# Patient Record
Sex: Female | Born: 1985 | Race: White | Hispanic: No | Marital: Married | Smoking: Former smoker
Health system: Southern US, Community
[De-identification: ages and names within clinical notes are randomized; demographics above are authoritative.]

## PROBLEM LIST (undated history)

## (undated) ENCOUNTER — Inpatient Hospital Stay (HOSPITAL_COMMUNITY): Payer: Self-pay

## (undated) DIAGNOSIS — F419 Anxiety disorder, unspecified: Secondary | ICD-10-CM

## (undated) DIAGNOSIS — D649 Anemia, unspecified: Secondary | ICD-10-CM

## (undated) DIAGNOSIS — R519 Headache, unspecified: Secondary | ICD-10-CM

## (undated) DIAGNOSIS — O139 Gestational [pregnancy-induced] hypertension without significant proteinuria, unspecified trimester: Secondary | ICD-10-CM

## (undated) DIAGNOSIS — L309 Dermatitis, unspecified: Secondary | ICD-10-CM

## (undated) DIAGNOSIS — B359 Dermatophytosis, unspecified: Secondary | ICD-10-CM

## (undated) DIAGNOSIS — G43909 Migraine, unspecified, not intractable, without status migrainosus: Secondary | ICD-10-CM

## (undated) HISTORY — DX: Anemia, unspecified: D64.9

## (undated) HISTORY — DX: Gestational (pregnancy-induced) hypertension without significant proteinuria, unspecified trimester: O13.9

## (undated) HISTORY — PX: WISDOM TOOTH EXTRACTION: SHX21

## (undated) HISTORY — DX: Anxiety disorder, unspecified: F41.9

## (undated) HISTORY — DX: Migraine, unspecified, not intractable, without status migrainosus: G43.909

## (undated) HISTORY — DX: Headache, unspecified: R51.9

---

## 2008-06-01 ENCOUNTER — Ambulatory Visit: Payer: Self-pay | Admitting: Nurse Practitioner

## 2008-06-01 DIAGNOSIS — L259 Unspecified contact dermatitis, unspecified cause: Secondary | ICD-10-CM | POA: Insufficient documentation

## 2008-06-01 DIAGNOSIS — F411 Generalized anxiety disorder: Secondary | ICD-10-CM | POA: Insufficient documentation

## 2008-06-09 ENCOUNTER — Encounter (INDEPENDENT_AMBULATORY_CARE_PROVIDER_SITE_OTHER): Payer: Self-pay | Admitting: Nurse Practitioner

## 2008-09-01 ENCOUNTER — Telehealth (INDEPENDENT_AMBULATORY_CARE_PROVIDER_SITE_OTHER): Payer: Self-pay | Admitting: *Deleted

## 2009-01-28 ENCOUNTER — Encounter (INDEPENDENT_AMBULATORY_CARE_PROVIDER_SITE_OTHER): Payer: Self-pay | Admitting: Nurse Practitioner

## 2009-01-28 ENCOUNTER — Ambulatory Visit: Payer: Self-pay | Admitting: Nurse Practitioner

## 2009-01-28 DIAGNOSIS — F17200 Nicotine dependence, unspecified, uncomplicated: Secondary | ICD-10-CM

## 2009-01-28 LAB — CONVERTED CEMR LAB
Bilirubin Urine: NEGATIVE
Blood in Urine, dipstick: NEGATIVE
Glucose, Urine, Semiquant: NEGATIVE
KOH Prep: NEGATIVE
Ketones, urine, test strip: NEGATIVE
Nitrite: NEGATIVE
Specific Gravity, Urine: 1.015
Urobilinogen, UA: 0.2
WBC Urine, dipstick: NEGATIVE
pH: 7.5

## 2009-01-29 ENCOUNTER — Encounter (INDEPENDENT_AMBULATORY_CARE_PROVIDER_SITE_OTHER): Payer: Self-pay | Admitting: Nurse Practitioner

## 2009-02-04 ENCOUNTER — Encounter (INDEPENDENT_AMBULATORY_CARE_PROVIDER_SITE_OTHER): Payer: Self-pay | Admitting: Nurse Practitioner

## 2009-02-09 ENCOUNTER — Encounter (INDEPENDENT_AMBULATORY_CARE_PROVIDER_SITE_OTHER): Payer: Self-pay | Admitting: *Deleted

## 2009-02-11 LAB — CONVERTED CEMR LAB
ALT: 148 units/L — ABNORMAL HIGH (ref 0–35)
AST: 80 units/L — ABNORMAL HIGH (ref 0–37)
Albumin: 4.4 g/dL (ref 3.5–5.2)
Alkaline Phosphatase: 53 units/L (ref 39–117)
BUN: 7 mg/dL (ref 6–23)
Basophils Absolute: 0 10*3/uL (ref 0.0–0.1)
Basophils Relative: 0 % (ref 0–1)
CO2: 24 meq/L (ref 19–32)
Calcium: 9.4 mg/dL (ref 8.4–10.5)
Chlamydia, DNA Probe: NEGATIVE
Chloride: 106 meq/L (ref 96–112)
Cholesterol: 219 mg/dL — ABNORMAL HIGH (ref 0–200)
Creatinine, Ser: 0.75 mg/dL (ref 0.40–1.20)
Eosinophils Absolute: 0.1 10*3/uL (ref 0.0–0.7)
Eosinophils Relative: 1 % (ref 0–5)
GC Probe Amp, Genital: NEGATIVE
Glucose, Bld: 91 mg/dL (ref 70–99)
HCT: 37.6 % (ref 36.0–46.0)
HDL: 57 mg/dL (ref >39)
Hemoglobin: 12.5 g/dL (ref 12.0–15.0)
LDL Cholesterol: 144 mg/dL — ABNORMAL HIGH (ref 0–99)
Lymphocytes Relative: 32 % (ref 12–46)
Lymphs Abs: 1.8 10*3/uL (ref 0.7–4.0)
MCHC: 33.2 g/dL (ref 30.0–36.0)
MCV: 94.5 fL (ref 78.0–100.0)
Monocytes Absolute: 0.4 10*3/uL (ref 0.1–1.0)
Monocytes Relative: 8 % (ref 3–12)
Neutro Abs: 3.2 10*3/uL (ref 1.7–7.7)
Neutrophils Relative %: 59 % (ref 43–77)
Platelets: 215 10*3/uL (ref 150–400)
Potassium: 4 meq/L (ref 3.5–5.3)
RBC: 3.98 M/uL (ref 3.87–5.11)
RDW: 15.2 % (ref 11.5–15.5)
Sodium: 141 meq/L (ref 135–145)
TSH: 2.463 microintl units/mL (ref 0.350–4.500)
Total Bilirubin: 0.3 mg/dL (ref 0.3–1.2)
Total CHOL/HDL Ratio: 3.8
Total Protein: 7.3 g/dL (ref 6.0–8.3)
Triglycerides: 91 mg/dL (ref <150)
VLDL: 18 mg/dL (ref 0–40)
WBC: 5.5 10*3/uL (ref 4.0–10.5)

## 2009-02-12 ENCOUNTER — Telehealth (INDEPENDENT_AMBULATORY_CARE_PROVIDER_SITE_OTHER): Payer: Self-pay | Admitting: Nurse Practitioner

## 2009-02-17 ENCOUNTER — Telehealth (INDEPENDENT_AMBULATORY_CARE_PROVIDER_SITE_OTHER): Payer: Self-pay | Admitting: Nurse Practitioner

## 2010-02-10 ENCOUNTER — Emergency Department (HOSPITAL_COMMUNITY): Admission: EM | Admit: 2010-02-10 | Discharge: 2010-02-10 | Payer: Self-pay | Admitting: Emergency Medicine

## 2010-02-11 ENCOUNTER — Encounter: Payer: Self-pay | Admitting: Family Medicine

## 2010-02-11 ENCOUNTER — Ambulatory Visit (HOSPITAL_COMMUNITY): Admission: RE | Admit: 2010-02-11 | Discharge: 2010-02-11 | Payer: Self-pay | Admitting: Family Medicine

## 2010-03-03 ENCOUNTER — Ambulatory Visit (HOSPITAL_COMMUNITY): Admission: RE | Admit: 2010-03-03 | Discharge: 2010-03-03 | Payer: Self-pay | Admitting: Family Medicine

## 2010-03-10 ENCOUNTER — Ambulatory Visit: Payer: Self-pay | Admitting: Family Medicine

## 2010-03-10 ENCOUNTER — Encounter (INDEPENDENT_AMBULATORY_CARE_PROVIDER_SITE_OTHER): Payer: Self-pay | Admitting: *Deleted

## 2010-03-10 LAB — CONVERTED CEMR LAB
ALT: 36 units/L — ABNORMAL HIGH (ref 0–35)
AST: 26 units/L (ref 0–37)
Albumin: 3.8 g/dL (ref 3.5–5.2)
Alkaline Phosphatase: 47 units/L (ref 39–117)
BUN: 5 mg/dL — ABNORMAL LOW (ref 6–23)
CO2: 18 meq/L — ABNORMAL LOW (ref 19–32)
Calcium: 9.2 mg/dL (ref 8.4–10.5)
Chloride: 106 meq/L (ref 96–112)
Creatinine, Ser: 0.48 mg/dL (ref 0.40–1.20)
Glucose, Bld: 88 mg/dL (ref 70–99)
HCT: 33.1 % — ABNORMAL LOW (ref 36.0–46.0)
Hemoglobin: 11.2 g/dL — ABNORMAL LOW (ref 12.0–15.0)
MCHC: 33.8 g/dL (ref 30.0–36.0)
MCV: 93.5 fL (ref 78.0–100.0)
Platelets: 273 10*3/uL (ref 150–400)
Potassium: 3.9 meq/L (ref 3.5–5.3)
RBC: 3.54 M/uL — ABNORMAL LOW (ref 3.87–5.11)
RDW: 14.1 % (ref 11.5–15.5)
Sodium: 135 meq/L (ref 135–145)
TSH: 1.395 microintl units/mL (ref 0.350–4.500)
Total Bilirubin: 0.2 mg/dL — ABNORMAL LOW (ref 0.3–1.2)
Total Protein: 6.7 g/dL (ref 6.0–8.3)
Uric Acid, Serum: 4 mg/dL (ref 2.4–7.0)
WBC: 8.6 10*3/uL (ref 4.0–10.5)

## 2010-03-12 ENCOUNTER — Encounter (INDEPENDENT_AMBULATORY_CARE_PROVIDER_SITE_OTHER): Payer: Self-pay | Admitting: *Deleted

## 2010-03-12 LAB — CONVERTED CEMR LAB
Collection Interval-CRCL: 24 hr
Creatinine 24 HR UR: 1311 mg/24hr (ref 700–1800)
Creatinine Clearance: 190 mL/min — ABNORMAL HIGH (ref 75–115)
Creatinine, Urine: 49.9 mg/dL

## 2010-03-24 ENCOUNTER — Ambulatory Visit: Payer: Self-pay | Admitting: Family Medicine

## 2010-03-24 ENCOUNTER — Ambulatory Visit (HOSPITAL_COMMUNITY): Admission: RE | Admit: 2010-03-24 | Discharge: 2010-03-24 | Payer: Self-pay | Admitting: Family Medicine

## 2010-04-14 ENCOUNTER — Ambulatory Visit: Payer: Self-pay | Admitting: Obstetrics and Gynecology

## 2010-04-14 ENCOUNTER — Encounter (INDEPENDENT_AMBULATORY_CARE_PROVIDER_SITE_OTHER): Payer: Self-pay | Admitting: *Deleted

## 2010-04-14 LAB — CONVERTED CEMR LAB
Clue Cells Wet Prep HPF POC: NONE SEEN
Trich, Wet Prep: NONE SEEN

## 2010-05-05 ENCOUNTER — Ambulatory Visit: Payer: Self-pay | Admitting: Family Medicine

## 2010-05-05 ENCOUNTER — Encounter: Payer: Self-pay | Admitting: Obstetrics and Gynecology

## 2010-05-26 ENCOUNTER — Encounter: Payer: Self-pay | Admitting: Physician Assistant

## 2010-05-26 ENCOUNTER — Ambulatory Visit: Payer: Self-pay | Admitting: Obstetrics & Gynecology

## 2010-05-26 LAB — CONVERTED CEMR LAB
ALT: 11 units/L (ref 0–35)
AST: 15 units/L (ref 0–37)
Albumin: 3.6 g/dL (ref 3.5–5.2)
Alkaline Phosphatase: 85 units/L (ref 39–117)
BUN: 6 mg/dL (ref 6–23)
CO2: 16 meq/L — ABNORMAL LOW (ref 19–32)
Calcium: 8.8 mg/dL (ref 8.4–10.5)
Chloride: 104 meq/L (ref 96–112)
Creatinine, Ser: 0.43 mg/dL (ref 0.40–1.20)
Creatinine, Urine: 105.3 mg/dL
Glucose, Bld: 150 mg/dL — ABNORMAL HIGH (ref 70–99)
HCT: 30.2 % — ABNORMAL LOW (ref 36.0–46.0)
Hemoglobin: 9.7 g/dL — ABNORMAL LOW (ref 12.0–15.0)
MCHC: 32.1 g/dL (ref 30.0–36.0)
MCV: 93.8 fL (ref 78.0–100.0)
Platelets: 291 10*3/uL (ref 150–400)
Potassium: 3.9 meq/L (ref 3.5–5.3)
RBC: 3.22 M/uL — ABNORMAL LOW (ref 3.87–5.11)
RDW: 13.9 % (ref 11.5–15.5)
Sodium: 137 meq/L (ref 135–145)
Total Bilirubin: 0.2 mg/dL — ABNORMAL LOW (ref 0.3–1.2)
Total Protein, Urine: 4
Total Protein: 5.9 g/dL — ABNORMAL LOW (ref 6.0–8.3)
WBC: 12 10*3/uL — ABNORMAL HIGH (ref 4.0–10.5)

## 2010-06-02 ENCOUNTER — Ambulatory Visit (HOSPITAL_COMMUNITY): Admission: RE | Admit: 2010-06-02 | Discharge: 2010-06-02 | Payer: Self-pay | Admitting: Obstetrics and Gynecology

## 2010-06-05 ENCOUNTER — Ambulatory Visit: Payer: Self-pay | Admitting: Obstetrics and Gynecology

## 2010-06-05 ENCOUNTER — Inpatient Hospital Stay (HOSPITAL_COMMUNITY): Admission: AD | Admit: 2010-06-05 | Discharge: 2010-06-05 | Payer: Self-pay | Admitting: Obstetrics & Gynecology

## 2010-06-09 ENCOUNTER — Ambulatory Visit: Payer: Self-pay | Admitting: Obstetrics & Gynecology

## 2010-06-14 ENCOUNTER — Inpatient Hospital Stay (HOSPITAL_COMMUNITY): Admission: AD | Admit: 2010-06-14 | Discharge: 2010-06-14 | Payer: Self-pay | Admitting: Family Medicine

## 2010-06-23 ENCOUNTER — Ambulatory Visit: Payer: Self-pay | Admitting: Family Medicine

## 2010-06-30 ENCOUNTER — Ambulatory Visit: Payer: Self-pay | Admitting: Obstetrics & Gynecology

## 2010-06-30 ENCOUNTER — Encounter: Payer: Self-pay | Admitting: Physician Assistant

## 2010-06-30 LAB — CONVERTED CEMR LAB
Clue Cells Wet Prep HPF POC: NONE SEEN
Trich, Wet Prep: NONE SEEN

## 2010-07-04 ENCOUNTER — Ambulatory Visit: Payer: Self-pay | Admitting: Obstetrics and Gynecology

## 2010-07-07 ENCOUNTER — Ambulatory Visit: Payer: Self-pay | Admitting: Obstetrics and Gynecology

## 2010-07-11 ENCOUNTER — Ambulatory Visit: Payer: Self-pay | Admitting: Obstetrics & Gynecology

## 2010-07-11 ENCOUNTER — Ambulatory Visit (HOSPITAL_COMMUNITY)
Admission: RE | Admit: 2010-07-11 | Discharge: 2010-07-11 | Payer: Self-pay | Source: Home / Self Care | Admitting: Obstetrics & Gynecology

## 2010-07-13 ENCOUNTER — Ambulatory Visit: Payer: Self-pay | Admitting: Obstetrics and Gynecology

## 2010-07-18 ENCOUNTER — Ambulatory Visit: Payer: Self-pay | Admitting: Obstetrics & Gynecology

## 2010-07-21 ENCOUNTER — Ambulatory Visit: Payer: Self-pay | Admitting: Family Medicine

## 2010-07-25 ENCOUNTER — Ambulatory Visit: Payer: Self-pay | Admitting: Physician Assistant

## 2010-07-28 ENCOUNTER — Inpatient Hospital Stay (HOSPITAL_COMMUNITY): Admission: AD | Admit: 2010-07-28 | Discharge: 2010-07-08 | Payer: Self-pay | Admitting: Obstetrics & Gynecology

## 2010-07-28 ENCOUNTER — Encounter: Payer: Self-pay | Admitting: Physician Assistant

## 2010-07-28 ENCOUNTER — Ambulatory Visit: Payer: Self-pay | Admitting: Obstetrics & Gynecology

## 2010-07-28 LAB — CONVERTED CEMR LAB
Chlamydia, DNA Probe: NEGATIVE
GC Probe Amp, Genital: NEGATIVE

## 2010-07-29 ENCOUNTER — Encounter: Payer: Self-pay | Admitting: Obstetrics & Gynecology

## 2010-08-01 ENCOUNTER — Ambulatory Visit: Payer: Self-pay | Admitting: Obstetrics & Gynecology

## 2010-08-04 ENCOUNTER — Ambulatory Visit: Payer: Self-pay | Admitting: Obstetrics & Gynecology

## 2010-08-08 ENCOUNTER — Ambulatory Visit: Payer: Self-pay | Admitting: Obstetrics & Gynecology

## 2010-08-08 ENCOUNTER — Ambulatory Visit (HOSPITAL_COMMUNITY)
Admission: RE | Admit: 2010-08-08 | Discharge: 2010-08-08 | Payer: Self-pay | Source: Home / Self Care | Attending: Family Medicine | Admitting: Family Medicine

## 2010-08-11 ENCOUNTER — Ambulatory Visit: Payer: Self-pay | Admitting: Family Medicine

## 2010-08-11 ENCOUNTER — Encounter: Payer: Self-pay | Admitting: Obstetrics and Gynecology

## 2010-08-11 ENCOUNTER — Inpatient Hospital Stay (HOSPITAL_COMMUNITY)
Admission: AD | Admit: 2010-08-11 | Discharge: 2010-08-12 | Payer: Self-pay | Source: Home / Self Care | Attending: Obstetrics & Gynecology | Admitting: Obstetrics & Gynecology

## 2010-08-16 ENCOUNTER — Ambulatory Visit: Payer: Self-pay | Admitting: Obstetrics and Gynecology

## 2010-08-18 ENCOUNTER — Ambulatory Visit: Payer: Self-pay | Admitting: Obstetrics & Gynecology

## 2010-08-20 ENCOUNTER — Inpatient Hospital Stay (HOSPITAL_COMMUNITY)
Admission: AD | Admit: 2010-08-20 | Discharge: 2010-08-20 | Payer: Self-pay | Source: Home / Self Care | Attending: Obstetrics & Gynecology | Admitting: Obstetrics & Gynecology

## 2010-08-21 DIAGNOSIS — O139 Gestational [pregnancy-induced] hypertension without significant proteinuria, unspecified trimester: Secondary | ICD-10-CM

## 2010-08-21 HISTORY — DX: Gestational (pregnancy-induced) hypertension without significant proteinuria, unspecified trimester: O13.9

## 2010-08-22 ENCOUNTER — Ambulatory Visit
Admission: RE | Admit: 2010-08-22 | Discharge: 2010-08-22 | Payer: Self-pay | Source: Home / Self Care | Attending: Obstetrics and Gynecology | Admitting: Obstetrics and Gynecology

## 2010-08-23 ENCOUNTER — Inpatient Hospital Stay (HOSPITAL_COMMUNITY)
Admission: RE | Admit: 2010-08-23 | Discharge: 2010-08-28 | Payer: Self-pay | Source: Home / Self Care | Attending: Family Medicine | Admitting: Family Medicine

## 2010-08-26 LAB — CBC
HCT: 19.1 % — ABNORMAL LOW (ref 36.0–46.0)
Hemoglobin: 6.3 g/dL — CL (ref 12.0–15.0)
MCH: 27.9 pg (ref 26.0–34.0)
MCHC: 33 g/dL (ref 30.0–36.0)
MCV: 84.5 fL (ref 78.0–100.0)
Platelets: 183 10*3/uL (ref 150–400)
RBC: 2.26 MIL/uL — ABNORMAL LOW (ref 3.87–5.11)
RDW: 16.6 % — ABNORMAL HIGH (ref 11.5–15.5)
WBC: 16.5 10*3/uL — ABNORMAL HIGH (ref 4.0–10.5)

## 2010-09-05 LAB — CBC
HCT: 19.5 % — ABNORMAL LOW (ref 36.0–46.0)
Hemoglobin: 6.3 g/dL — CL (ref 12.0–15.0)
MCH: 28 pg (ref 26.0–34.0)
MCHC: 32.3 g/dL (ref 30.0–36.0)
MCV: 86.7 fL (ref 78.0–100.0)
Platelets: 260 10*3/uL (ref 150–400)
RBC: 2.25 MIL/uL — ABNORMAL LOW (ref 3.87–5.11)
RDW: 17.2 % — ABNORMAL HIGH (ref 11.5–15.5)
WBC: 8.3 10*3/uL (ref 4.0–10.5)

## 2010-09-10 NOTE — Discharge Summary (Signed)
  NAMESOLASH, TULLO               ACCOUNT NO.:  [id number]  MEDICAL RECORD NO.:  [id number]          PATIENT TYPE:  INP  LOCATION:  9133                          [facility]ILITY:  [facility]  PHYSICIAN:  Tilda Burrow, M.D. DATE OF BIRTH:  26-Nov-1985  DATE OF ADMISSION:  08/23/2010 DATE OF DISCHARGE:  08/28/2010                              DISCHARGE SUMMARY   ADMISSION DIAGNOSES: 1. Chronic hypertension. 2. Intrauterine pregnancy at 40 and 1 week.  LABORATORY DATA:  Hemoglobin 6.3 on August 27, 2010.  HISTORY AND PHYSICAL:  This is a 25 year old G1 female at 26 weeks' gestation who was admitted for induction of labor due to chronic hypertension.  Her induction was pursued initially, however, was deemed a failure secondary to arrest of descent and cephalopelvic disproportion.  Additionally, the patient developed a low-grade temperature and was treated with ampicillin and gentamicin for chorioamnionitis.  Due to these complications, the patient was scheduled for a primary low transverse cervical C-section which was performed on August 25, 2010.  This procedure was uncomplicated with an EBL of 700 mL.  She delivered a viable female infant, was found to be in right occiput posterior position, weighing 9 pounds and 9 ounces.  Apgars were 9 and 9 at 1 and 5 minutes respectively.  The patient's postoperative course was uncomplicated with exception for development of blood loss anemia with hemoglobin of 6.3 postoperatively.  Initially, the patient experienced some mild dizziness, however, orthostatics were negative and repeat CBC found her hemoglobin to be stable at postop day #2.  The patient was asymptomatic at the time of discharge.  She will receive a Depo-Provera injection prior to discharge and plans to bottle feed her infant.  He also received a circumcision on the day of discharge.  DISPOSITION:  The patient will be discharged to home with her in stable medical condition.  DISCHARGE  MEDICATIONS: 1. Percocet 5/325 mg p.o. q.4 hours p.r.n. 2. Motrin 600 mg p.o. q.6 hours p.r.n. 3. Iron sulfate 325 mg p.o. b.i.d. 4. Colace 100 mg p.o. p.r.n.  DISCHARGE FOLLOWUP:  The patient will follow up with [facility] within 6 weeks after discharge.  DISCHARGE ATTENDING:  Tilda Burrow, MD    ______________________________ Lloyd Huger, MD   ______________________________ Tilda Burrow, M.D.    JK/MEDQ  D:  08/28/2010  T:  08/29/2010  Job:  [id number]  Electronically Signed by Lloyd Huger MD on 08/31/2010 02:19:08 PM Electronically Signed by Christin Bach M.D. on 09/10/2010 06:56:56 AM

## 2010-09-22 ENCOUNTER — Ambulatory Visit: Admit: 2010-09-22 | Payer: Self-pay | Admitting: Obstetrics and Gynecology

## 2010-09-22 ENCOUNTER — Ambulatory Visit: Payer: Self-pay | Admitting: Physician Assistant

## 2010-09-22 ENCOUNTER — Encounter: Payer: Self-pay | Admitting: Physician Assistant

## 2010-09-22 LAB — CONVERTED CEMR LAB
HCT: 38.4 % (ref 36.0–46.0)
Hemoglobin: 12.5 g/dL (ref 12.0–15.0)
MCHC: 32.6 g/dL (ref 30.0–36.0)
MCV: 84.4 fL (ref 78.0–100.0)
Platelets: 283 10*3/uL (ref 150–400)
RBC: 4.55 M/uL (ref 3.87–5.11)
RDW: 15.9 % — ABNORMAL HIGH (ref 11.5–15.5)
WBC: 7.1 10*3/uL (ref 4.0–10.5)

## 2010-10-31 LAB — POCT URINALYSIS DIPSTICK
Bilirubin Urine: NEGATIVE
Bilirubin Urine: NEGATIVE
Bilirubin Urine: NEGATIVE
Glucose, UA: NEGATIVE mg/dL
Glucose, UA: NEGATIVE mg/dL
Glucose, UA: NEGATIVE mg/dL
Hgb urine dipstick: NEGATIVE
Hgb urine dipstick: NEGATIVE
Hgb urine dipstick: NEGATIVE
Ketones, ur: NEGATIVE mg/dL
Nitrite: NEGATIVE
Nitrite: NEGATIVE
Nitrite: NEGATIVE
Protein, ur: NEGATIVE mg/dL
Protein, ur: NEGATIVE mg/dL
Protein, ur: NEGATIVE mg/dL
Specific Gravity, Urine: 1.025 (ref 1.005–1.030)
Specific Gravity, Urine: 1.025 (ref 1.005–1.030)
Specific Gravity, Urine: >=1.03 (ref 1.005–1.030)
Urobilinogen, UA: 0.2 mg/dL (ref 0.0–1.0)
Urobilinogen, UA: 0.2 mg/dL (ref 0.0–1.0)
Urobilinogen, UA: 0.2 mg/dL (ref 0.0–1.0)
pH: 6.5 (ref 5.0–8.0)
pH: 6.5 (ref 5.0–8.0)
pH: 7 (ref 5.0–8.0)

## 2010-10-31 LAB — WET PREP, GENITAL
Clue Cells Wet Prep HPF POC: NONE SEEN
Trich, Wet Prep: NONE SEEN
Yeast Wet Prep HPF POC: NONE SEEN

## 2010-10-31 LAB — CBC
Hemoglobin: 10.2 g/dL — ABNORMAL LOW (ref 12.0–15.0)
MCH: 28.3 pg (ref 26.0–34.0)
MCHC: 32.9 g/dL (ref 30.0–36.0)
MCV: 85.9 fL (ref 78.0–100.0)

## 2010-10-31 LAB — GLUCOSE, CAPILLARY
Glucose-Capillary: 107 mg/dL — ABNORMAL HIGH (ref 70–99)
Glucose-Capillary: 126 mg/dL — ABNORMAL HIGH (ref 70–99)
Glucose-Capillary: 81 mg/dL (ref 70–99)

## 2010-10-31 LAB — RPR: RPR Ser Ql: NONREACTIVE

## 2010-11-01 LAB — POCT URINALYSIS DIPSTICK
Glucose, UA: NEGATIVE mg/dL
Glucose, UA: NEGATIVE mg/dL
Hgb urine dipstick: NEGATIVE
Hgb urine dipstick: NEGATIVE
Hgb urine dipstick: NEGATIVE
Ketones, ur: NEGATIVE mg/dL
Ketones, ur: NEGATIVE mg/dL
Nitrite: NEGATIVE
Nitrite: NEGATIVE
Nitrite: NEGATIVE
Protein, ur: NEGATIVE mg/dL
Protein, ur: NEGATIVE mg/dL
Protein, ur: NEGATIVE mg/dL
Protein, ur: NEGATIVE mg/dL
Protein, ur: NEGATIVE mg/dL
Specific Gravity, Urine: 1.02 (ref 1.005–1.030)
Specific Gravity, Urine: 1.02 (ref 1.005–1.030)
Specific Gravity, Urine: 1.025 (ref 1.005–1.030)
Urobilinogen, UA: 0.2 mg/dL (ref 0.0–1.0)
Urobilinogen, UA: 0.2 mg/dL (ref 0.0–1.0)
Urobilinogen, UA: 0.2 mg/dL (ref 0.0–1.0)
Urobilinogen, UA: 0.2 mg/dL (ref 0.0–1.0)
Urobilinogen, UA: 0.2 mg/dL (ref 0.0–1.0)
pH: 6.5 (ref 5.0–8.0)
pH: 7 (ref 5.0–8.0)
pH: 6 (ref 5.0–8.0)
pH: 6.5 (ref 5.0–8.0)

## 2010-11-01 LAB — URINALYSIS, ROUTINE W REFLEX MICROSCOPIC
Glucose, UA: NEGATIVE mg/dL
Ketones, ur: NEGATIVE mg/dL
Leukocytes, UA: NEGATIVE
Protein, ur: NEGATIVE mg/dL
Urobilinogen, UA: 0.2 mg/dL (ref 0.0–1.0)

## 2010-11-01 LAB — URINE MICROSCOPIC-ADD ON

## 2010-11-02 LAB — POCT URINALYSIS DIPSTICK
Glucose, UA: NEGATIVE mg/dL
Hgb urine dipstick: NEGATIVE
Ketones, ur: NEGATIVE mg/dL
Specific Gravity, Urine: 1.02 (ref 1.005–1.030)

## 2010-11-02 LAB — URINALYSIS, ROUTINE W REFLEX MICROSCOPIC
Bilirubin Urine: NEGATIVE
Ketones, ur: NEGATIVE mg/dL
Nitrite: NEGATIVE
Protein, ur: NEGATIVE mg/dL
Urobilinogen, UA: 0.2 mg/dL (ref 0.0–1.0)

## 2010-11-03 LAB — POCT URINALYSIS DIPSTICK
Bilirubin Urine: NEGATIVE
Ketones, ur: NEGATIVE mg/dL
Ketones, ur: NEGATIVE mg/dL
Protein, ur: NEGATIVE mg/dL
Protein, ur: NEGATIVE mg/dL
Specific Gravity, Urine: 1.025 (ref 1.005–1.030)
Specific Gravity, Urine: 1.02 (ref 1.005–1.030)
Urobilinogen, UA: 0.2 mg/dL (ref 0.0–1.0)
pH: 6 (ref 5.0–8.0)

## 2010-11-04 LAB — POCT URINALYSIS DIPSTICK
Bilirubin Urine: NEGATIVE
Bilirubin Urine: NEGATIVE
Hgb urine dipstick: NEGATIVE
Hgb urine dipstick: NEGATIVE
Ketones, ur: NEGATIVE mg/dL
Nitrite: NEGATIVE
Protein, ur: NEGATIVE mg/dL
Specific Gravity, Urine: 1.025 (ref 1.005–1.030)
Urobilinogen, UA: 0.2 mg/dL (ref 0.0–1.0)
pH: 6 (ref 5.0–8.0)
pH: 5.5 (ref 5.0–8.0)

## 2010-11-05 LAB — POCT URINALYSIS DIP (DEVICE)
Ketones, ur: NEGATIVE mg/dL
Protein, ur: NEGATIVE mg/dL
Specific Gravity, Urine: 1.02 (ref 1.005–1.030)
Urobilinogen, UA: 0.2 mg/dL (ref 0.0–1.0)
pH: 7 (ref 5.0–8.0)

## 2010-11-06 LAB — URINALYSIS, ROUTINE W REFLEX MICROSCOPIC
Bilirubin Urine: NEGATIVE
Glucose, UA: NEGATIVE mg/dL
Ketones, ur: NEGATIVE mg/dL
Protein, ur: NEGATIVE mg/dL
pH: 6.5 (ref 5.0–8.0)

## 2010-11-06 LAB — URINE MICROSCOPIC-ADD ON

## 2010-11-06 LAB — WET PREP, GENITAL: Yeast Wet Prep HPF POC: NONE SEEN

## 2010-11-06 LAB — HCG, QUANTITATIVE, PREGNANCY: hCG, Beta Chain, Quant, S: 77130 m[IU]/mL — ABNORMAL HIGH (ref <5)

## 2010-11-06 LAB — ABO/RH: ABO/RH(D): A POS

## 2010-11-09 ENCOUNTER — Encounter: Payer: Self-pay | Admitting: Family Medicine

## 2010-11-09 ENCOUNTER — Ambulatory Visit (INDEPENDENT_AMBULATORY_CARE_PROVIDER_SITE_OTHER): Payer: Medicaid Other | Admitting: Family Medicine

## 2010-11-09 VITALS — BP 129/78 | HR 85 | Temp 98.5°F | Ht 65.5 in | Wt 173.8 lb

## 2010-11-09 DIAGNOSIS — D649 Anemia, unspecified: Secondary | ICD-10-CM

## 2010-11-09 DIAGNOSIS — F411 Generalized anxiety disorder: Secondary | ICD-10-CM

## 2010-11-09 DIAGNOSIS — IMO0002 Reserved for concepts with insufficient information to code with codable children: Secondary | ICD-10-CM

## 2010-11-09 DIAGNOSIS — O139 Gestational [pregnancy-induced] hypertension without significant proteinuria, unspecified trimester: Secondary | ICD-10-CM

## 2010-11-09 MED ORDER — ESCITALOPRAM OXALATE 20 MG PO TABS: ORAL_TABLET | ORAL | Status: DC

## 2010-11-09 NOTE — Patient Instructions (Addendum)
Start lexapro as discussed Consider adding daily exercise to help with mood and energy Make follow-up in 3-4 weeks Consider therapy- See info for Dr. Pascal Lux Anxiety and Panic Attacks Your caregiver has informed you that you are having an anxiety or panic attack. There may be many forms of this. Most of the time these attacks come suddenly and without warning. They come at any time of day, including periods of sleep, and at any time of life. They may be strong and unexplained. Although panic attacks are very scary, they are physically harmless. Sometimes the cause of your anxiety is not known. Anxiety is a protective mechanism of the body in its fight or flight mechanism. Most of these perceived danger situations are actually nonphysical situations (such as anxiety over losing a job). CAUSES The causes of an anxiety or panic attack are many. Panic attacks may occur in otherwise healthy people given a certain set of circumstances. There may be a genetic cause for panic attacks. Some medications may also have anxiety as a side effect. SYMPTOMS Some of the most common feelings are:  Intense terror.   Dizziness, feeling faint.     Hot and cold flashes.     Fear of going crazy.     Feelings that nothing is real.     Sweating.    Shaking.    Chest pain or a fast heartbeat (palpitations).   Smothering, choking sensations.     Feelings of impending doom and that death is near.     Tingling of extremities, this may be from over breathing.     Altered reality (derealization).     Being detached from yourself (depersonalization).     Several symptoms can be present to make up anxiety or panic attacks. DIAGNOSIS The evaluation by your caregiver will depend on the type of symptoms you are experiencing. The diagnosis of anxiety or pain attack is made when no physical illness can be determined to be a cause of the symptoms. TREATMENT Treatment to prevent anxiety and panic attacks may  include:  Avoidance of circumstances that cause anxiety.   Reassurance and relaxation.   Regular exercise.   Relaxation therapies, such as yoga.   Psychotherapy with a psychiatrist or therapist.   Avoidance of caffeine, alcohol and illegal drugs.   Prescribed medication.  SEEK IMMEDIATE MEDICAL CARE IF:  You experience panic attack symptoms that are different than your usual symptoms.   You have any worsening or concerning symptoms.  Document Released: 08/07/2005 Document Re-Released: 01/25/2010 [facility] Patient Information 2011 [place], [place].

## 2010-11-09 NOTE — Assessment & Plan Note (Signed)
Significant anemia 6.3 in January, patient does no think has been check since.  Will recheck today.

## 2010-11-09 NOTE — Assessment & Plan Note (Addendum)
BP Readings from Last 3 Encounters:  11/09/10 129/78  01/28/09 120/74  06/01/08 120/74   Seems to be resolved now.  History of elevated liver enzymes.  Will recheck today as starting new med.

## 2010-11-09 NOTE — Progress Notes (Signed)
  Subjective:    Patient ID: Tracey Chen, female    DOB: 1985/08/23, 25 y.o.   MRN: [id number]  HPI Referred by [facility] for primary care to start anxiety medication.  Patient states was on lexapro prior to pregnancy diagnosed at age 43-19 at [facility].  She responded well, but discontinued it prior to pregnancy.  She states she has high anxiety levels throughout pregnancy and continues to have increased levels of anxiety.  Also has history of  Panic attacks but has not had one in over 1 year.    Review of Systems  Constitutional: Positive for fatigue. Negative for fever.  Respiratory: Negative for cough and shortness of breath.   Cardiovascular: Negative for chest pain.  Gastrointestinal: Positive for diarrhea. Negative for abdominal pain and constipation.  Skin: Negative for rash.  Psychiatric/Behavioral: Positive for agitation. Negative for suicidal ideas. The patient is nervous/anxious.    See hpi    Objective:   Physical Exam  Constitutional: She appears well-developed and well-nourished.       Here with fiancee  Psychiatric: She has a normal mood and affect. Her behavior is normal. Judgment and thought content normal.       Appropriate with fiancee.  PHQ-9 Score 5= mild depression.  MDQ does not suggest bipolar disorder.  GAD-7 scored max in all categories except #5.  Total score 20.          Assessment & Plan:

## 2010-11-09 NOTE — Assessment & Plan Note (Signed)
Had previous good response to lexapro 20 mg.  Will restart and titrate up to previous dose.  Discussed role of therapy and exercise in treatment of anxiety and mild depression.  Given referral information.  Follow-up in 3-4 weeks.

## 2010-11-10 LAB — COMPREHENSIVE METABOLIC PANEL
ALT: 52 U/L — ABNORMAL HIGH (ref 0–35)
CO2: 22 mEq/L (ref 19–32)
Calcium: 9.7 mg/dL (ref 8.4–10.5)
Chloride: 108 mEq/L (ref 96–112)
Creat: 0.71 mg/dL (ref 0.40–1.20)
Glucose, Bld: 89 mg/dL (ref 70–99)
Total Bilirubin: 0.3 mg/dL (ref 0.3–1.2)
Total Protein: 7.3 g/dL (ref 6.0–8.3)

## 2010-11-10 LAB — CBC
Hemoglobin: 11.9 g/dL — ABNORMAL LOW (ref 12.0–15.0)
MCH: 27.7 pg (ref 26.0–34.0)
MCV: 85.3 fL (ref 78.0–100.0)
Platelets: 295 10*3/uL (ref 150–400)
RBC: 4.3 MIL/uL (ref 3.87–5.11)
WBC: 7.5 10*3/uL (ref 4.0–10.5)

## 2010-11-10 LAB — TSH: TSH: 1.133 u[IU]/mL (ref 0.350–4.500)

## 2010-11-11 ENCOUNTER — Encounter: Payer: Self-pay | Admitting: Family Medicine

## 2010-11-11 NOTE — Progress Notes (Signed)
[name], Tracey Chen               ACCOUNT NO.:  [id number]  MEDICAL RECORD NO.:  [id number]           PATIENT TYPE:  A  LOCATION:  [facility]                   FACILITY:  [facility]  PHYSICIAN:  Maylon Cos, CNM    DATE OF BIRTH:  1986-04-30  DATE OF SERVICE:  09/22/2010                                 CLINIC NOTE  The patient is being seen in GYN Clinic at [facility].  Reason for today's visit is 4-week postpartum visit.  HISTORY OF PRESENT ILLNESS:  The patient is a 25 year old gravida 1, para 1-0-0-1 who had a primary cesarean section for failure to descend, CPD on August 25, 2010, at 40 weeks and 1 day.  She delivered a female infant weighing 9 pounds and 9 ounces.  She had a normal hospital course and was discharged on postoperative days #3.  She was given Depo-Provera at the time of her discharge.  She presents today 4 weeks' status post cesarean section with no complaints.  She denies any postpartum blues or depression symptoms.  Her Edinburgh score is 4.  She has not had a period since her delivery.  She is having brown spotting daily.  She has not resumed intercourse.  She is no longer breast-feeding.  She is bottle-feeding only and has no complaints of her breast.  She also is sleeping well and adjusting well to her role as a mother and has excellent support of her father of her baby and both parents on both sides.  PHYSICAL EXAMINATION:  GENERAL:  On exam today, Tracey Chen is a pleasant 25- year-old Caucasian female who appears to be in no apparent distress. She does appear to be her stated age. VITAL SIGNS:  Her vital signs are stable.  Blood pressure is 139/73. Her weight today is 70.  She is 65 inches and weight is down from 202 at the time of delivery. HEENT:  Grossly normal. BREASTS:  Soft and nontender.  Her nipples are erect without discharge. ABDOMEN:  Soft.  Steri-Strips still in place, however, are removed during exam and revealed a well-healed subcu incision.   Abdomen is nontender. PELVIC:  No cervical motion tenderness, nonenlarged, and nontender uterus, not enlarged,  nontender adnexa. EXTREMITIES:  Equal strength x4.  Equal range of motion and no edema in lower extremities.  ASSESSMENT: 1. 4 weeks status post primary cesarean section for failure to descend     and cephalopelvic disproportion.  Female infant doing well. 2. Bottle feeding. 3. Contraceptive management.  PLAN:  Routine postpartum instructions, may resume activities, sexual intercourse, as well as abdominal exercises and cardiac at her leisure, instructed to go slow.  We will obtain a CBC today.  Continue prenatal vitamins return in June for annual exam and Pap smear.          ______________________________ Maylon Cos, CNM    SS/MEDQ  D:  09/22/2010  T:  09/23/2010  Job:  [id number]

## 2010-11-17 ENCOUNTER — Ambulatory Visit (INDEPENDENT_AMBULATORY_CARE_PROVIDER_SITE_OTHER): Payer: Medicaid Other

## 2010-11-17 DIAGNOSIS — Z3049 Encounter for surveillance of other contraceptives: Secondary | ICD-10-CM

## 2010-11-30 ENCOUNTER — Ambulatory Visit: Payer: Medicaid Other | Admitting: Family Medicine

## 2010-12-07 ENCOUNTER — Emergency Department (HOSPITAL_COMMUNITY)
Admission: EM | Admit: 2010-12-07 | Discharge: 2010-12-07 | Disposition: A | Discharge status: Home / Self Care | Payer: Medicaid Other | Attending: Emergency Medicine | Admitting: Emergency Medicine

## 2010-12-07 ENCOUNTER — Emergency Department (HOSPITAL_COMMUNITY): Payer: Medicaid Other

## 2010-12-07 DIAGNOSIS — S9030XA Contusion of unspecified foot, initial encounter: Secondary | ICD-10-CM | POA: Insufficient documentation

## 2010-12-07 DIAGNOSIS — M79609 Pain in unspecified limb: Secondary | ICD-10-CM | POA: Insufficient documentation

## 2010-12-07 DIAGNOSIS — W2209XA Striking against other stationary object, initial encounter: Secondary | ICD-10-CM | POA: Insufficient documentation

## 2010-12-12 ENCOUNTER — Ambulatory Visit (INDEPENDENT_AMBULATORY_CARE_PROVIDER_SITE_OTHER): Payer: Medicaid Other | Admitting: Family Medicine

## 2010-12-12 ENCOUNTER — Other Ambulatory Visit: Payer: Self-pay | Admitting: Family Medicine

## 2010-12-12 ENCOUNTER — Encounter: Payer: Self-pay | Admitting: Family Medicine

## 2010-12-12 VITALS — BP 131/72 | HR 76 | Temp 98.2°F | Ht 65.0 in | Wt 178.5 lb

## 2010-12-12 DIAGNOSIS — F411 Generalized anxiety disorder: Secondary | ICD-10-CM

## 2010-12-12 NOTE — Assessment & Plan Note (Signed)
As has only been on full dose lexapro for approx 3 weeks, which is max dose, will continue without medication change.  Again reviewed role of therapy with anxiety.  Will see back in 4-6 weeks, re-administer GAD and discuss need for change or adjunctive therapy.

## 2010-12-12 NOTE — Patient Instructions (Signed)
Continue lexapro 20 mg  See info for Dr. Pascal Lux   Follow-up in 4-6 weeks

## 2010-12-12 NOTE — Progress Notes (Signed)
  Subjective:    Patient ID: Tracey Chen, female    DOB: March 12, 1986, 25 y.o.   MRN: [id number]  HPIHere today with son Tracey Chen and her husband.  She is 3.5 months post partum.  Started on lexapro 3-4 weeks ago.  Is now up to 20 mg daily.  States she feels some improvement.  Continues to have excessive worry about her family's wellbeing and safety, labile moods, easily irritable, some sadness.  No SI, HI.  Tired with childcare but otherwise adjusting well.   Review of Systems See HPI    Objective:   Physical Exam  Constitutional: She appears well-developed and well-nourished.  Psychiatric: She has a normal mood and affect. Her behavior is normal.          Assessment & Plan:

## 2010-12-13 NOTE — Telephone Encounter (Signed)
Refill request

## 2010-12-27 ENCOUNTER — Ambulatory Visit: Payer: Medicaid Other | Admitting: Psychology

## 2011-01-18 ENCOUNTER — Ambulatory Visit: Payer: Medicaid Other | Admitting: Family Medicine

## 2011-01-24 ENCOUNTER — Ambulatory Visit (INDEPENDENT_AMBULATORY_CARE_PROVIDER_SITE_OTHER): Payer: Medicaid Other | Admitting: Family Medicine

## 2011-01-24 ENCOUNTER — Other Ambulatory Visit: Payer: Self-pay | Admitting: Family Medicine

## 2011-01-24 VITALS — BP 131/80 | HR 80 | Temp 97.9°F | Ht 65.0 in | Wt 184.0 lb

## 2011-01-24 DIAGNOSIS — F411 Generalized anxiety disorder: Secondary | ICD-10-CM

## 2011-01-24 DIAGNOSIS — N912 Amenorrhea, unspecified: Secondary | ICD-10-CM

## 2011-01-24 LAB — POCT URINE PREGNANCY: Preg Test, Ur: NEGATIVE

## 2011-01-24 MED ORDER — PAROXETINE HCL 20 MG PO TABS: 40.0000 mg | ORAL_TABLET | Freq: Every day | ORAL | Status: DC

## 2011-01-24 NOTE — Progress Notes (Signed)
  Subjective:    Patient ID: Tracey Chen, female    DOB: [birth year], 25 y.o.   MRN: [id number]  HPI  Here for follow-up of anxiety  Has been on > 2 months of lexapro at max dose.  Overall does not feel much improvement in worry, irritability.   She forgot to come to Psychology appt she had previously schedule but feels it would be helpful.  Consistent with  by GAD-7 score 20, same as previous Review of Systems    see HPI Objective:   Physical Exam  Constitutional: She appears well-developed and well-nourished. No distress.       Here with husband who is supportive  Psychiatric: She has a normal mood and affect. Her behavior is normal.          Assessment & Plan:

## 2011-01-24 NOTE — Assessment & Plan Note (Signed)
Will d/c lexapro as had no effect.  Has been on citalopram before with no response.  Will start paroxetine, f/u 4 weeks.  May benefit from augmentation with buspar.

## 2011-01-24 NOTE — Telephone Encounter (Signed)
Refill request

## 2011-02-02 ENCOUNTER — Ambulatory Visit (INDEPENDENT_AMBULATORY_CARE_PROVIDER_SITE_OTHER): Payer: Medicaid Other

## 2011-02-02 ENCOUNTER — Ambulatory Visit: Payer: Medicaid Other

## 2011-02-02 DIAGNOSIS — Z3049 Encounter for surveillance of other contraceptives: Secondary | ICD-10-CM

## 2011-02-16 ENCOUNTER — Ambulatory Visit: Payer: Medicaid Other | Admitting: Family Medicine

## 2011-03-14 ENCOUNTER — Ambulatory Visit (INDEPENDENT_AMBULATORY_CARE_PROVIDER_SITE_OTHER): Payer: Medicaid Other | Admitting: Family Medicine

## 2011-03-14 ENCOUNTER — Encounter: Payer: Self-pay | Admitting: Family Medicine

## 2011-03-14 VITALS — BP 142/82 | HR 77 | Temp 98.3°F | Wt 184.0 lb

## 2011-03-14 DIAGNOSIS — L282 Other prurigo: Secondary | ICD-10-CM | POA: Insufficient documentation

## 2011-03-14 MED ORDER — PREDNISONE 50 MG PO TABS: 50.0000 mg | ORAL_TABLET | Freq: Every day | ORAL | Status: AC

## 2011-03-14 NOTE — Progress Notes (Signed)
  Subjective:     Tracey Chen is a 25 y.o. female who presents for evaluation of a rash involving the calf and leg. Rash started 2 days ago. Lesions are pink and white clearing surrounding the them like wheels, and raised in texture. Rash has changed over time. Rash causes no discomfort. Associated symptoms: none. Patient denies: abdominal pain, arthralgia, congestion, cough, fever, headache, myalgia and nausea. Patient has not had contacts with similar rash. Patient has not had new exposures (soaps, lotions, laundry detergents, foods, medications, plants, insects or animals).  The following portions of the patient's history were reviewed and updated as appropriate: allergies, current medications, past family history, past medical history, past social history, past surgical history and problem list.  Review of Systems Pertinent items are noted in HPI.    Objective:    BP 142/82  Pulse 77  Temp(Src) 98.3 F (36.8 C) (Oral)  Wt 184 lb (83.462 kg) General:  alert and cooperative  Skin:  papules noted on extremities LE, bilateral mostly small with white clearing surrounding each papule. No exfoliation, no erythema, no swelling.      Assessment:    bites, insect    Plan:    Medications: benadryl and hydrocortisone. Written patient instruction given. Follow up in 1 week.

## 2011-03-14 NOTE — Patient Instructions (Signed)
Sorry I can't give you a better answer but I do think bug bites of some sort. Try taking loratadine (over the counter) daily to help Try hydrocortisone cream 1% twice daily until it resolves as well I will give you a script for prednisone to have incase it does not get better in the next 48 hours.  If not better come back in 1 week.

## 2011-03-14 NOTE — Assessment & Plan Note (Signed)
Likely secondary to bug bites, likely flea vs bed bug, gave warning signs and when to return, hydrocortisone and antihistamines, wait and see with prednisone as well see again in 1 week.

## 2011-04-12 ENCOUNTER — Ambulatory Visit: Payer: Medicaid Other | Admitting: Family Medicine

## 2011-04-20 ENCOUNTER — Ambulatory Visit (INDEPENDENT_AMBULATORY_CARE_PROVIDER_SITE_OTHER): Payer: Medicaid Other | Admitting: Physician Assistant

## 2011-04-20 ENCOUNTER — Encounter: Payer: Self-pay | Admitting: Obstetrics & Gynecology

## 2011-04-20 ENCOUNTER — Ambulatory Visit (INDEPENDENT_AMBULATORY_CARE_PROVIDER_SITE_OTHER): Payer: Medicaid Other

## 2011-04-20 VITALS — BP 118/78 | HR 78 | Temp 98.8°F | Ht 65.0 in | Wt 192.2 lb

## 2011-04-20 DIAGNOSIS — Z309 Encounter for contraceptive management, unspecified: Secondary | ICD-10-CM

## 2011-04-20 NOTE — Progress Notes (Signed)
Chief Complaint:  Contraception   Tracey Chen is  25 y.o. G1P1001.  Patient's last menstrual period was 11/19/2009. No period on Depo  Her pregnancy status is negative.  She presents complaining of Contraception Desires to change to another type of birth control, does not want to continue on Depo.     Obstetrical/Gynecological History: OB History    Grav Para Term Preterm Abortions TAB SAB Ect Mult Living   1 1 1       1       Past Medical History: Past Medical History  Diagnosis Date  . Anemia   . Anxiety     treated with lexapro at age 66  . Pregnancy induced hypertension 2012    Past Surgical History: Past Surgical History  Procedure Date  . Cesarean section     Family History: Family History  Problem Relation Age of Onset  . Thyroid disease Mother   . Mental illness Mother     PTSD from husband's death  . Asthma Neg Hx   . Cancer Neg Hx   . Heart disease Neg Hx   . Stroke Neg Hx     Social History: History  Substance Use Topics  . Smoking status: Current Everyday Smoker -- 0.5 packs/day    Types: Cigarettes  . Smokeless tobacco: Not on file  . Alcohol Use: No    Allergies:  Allergies  Allergen Reactions  . Latex Rash     (Not in a hospital admission)  Review of Systems - Negative except what is reviewed in HPI  Physical Exam   Blood pressure 118/78, pulse 78, temperature 98.8 F (37.1 C), temperature source Oral, height 5\' 5"  (1.651 m), weight 192 lb 3.2 oz (87.181 kg), last menstrual period 11/19/2009, not currently breastfeeding.  General: General appearance - alert, well appearing, and in no distress, oriented to person, place, and time and overweight Mental status - alert, oriented to person, place, and time, normal mood, behavior, speech, dress, motor activity, and thought processes, affect appropriate to mood Focused Gynecological Exam: normal external genitalia, vulva, vagina, cervix, uterus and adnexa, examination not  indicated    Assessment: Contraceptive Management  Patient Active Problem List  Diagnoses  . ANXIETY  . ECZEMA  . Anemia  . Pregnancy induced hypertension  . Papular urticaria    Plan: BC options reviewed at length. Literature given for Mirena and Implanon. Will schedule for next available Mirena appt. Pt to call and cancel if Implanon desired. Referral given to [facility] and [facility] for Implanon  SHORES,SUZANNE E. 04/20/2011,4:01 PM

## 2011-04-20 NOTE — Patient Instructions (Signed)
IMPORTANT: HOW TO USE THIS INFORMATION:  This is a summary and does NOT have all possible information about this product. This information does not assure that this product is safe, effective, or appropriate for you. This information is not individual medical advice and does not substitute for the advice of your health care professional. Always ask your health care professional for complete information about this product and your specific health needs.    ETONOGESTREL - IMPLANT (et-oh-no-GES-trel)    COMMON BRAND NAME(S): Implanon    USES:  This medication is used to prevent pregnancy. It is a thin plastic rod about the size of a matchstick that is inserted under the skin by a health care professional. The rod slowly releases etonogestrel into the body over a 3-year period. The rod must be removed after 3 years and can be replaced if continued birth control is desired. The rod can be removed at any time by a trained health care professional if birth control is no longer desired or there are side effects. It does not contain any estrogen. Etonogestrel (a form of progestin) is a hormone that prevents pregnancy by preventing the release of an egg (ovulation) and by changing the womb and cervical mucus to make it more difficult for an egg to meet sperm (fertilization) or for the fertilized egg to attach to the wall of the womb (implantation). This medication may not work as well in women who are very overweight or those taking certain drugs. (See also Drug Interactions section.) Discuss your birth control options with your doctor. Using this medication does not protect you or your partner against sexually transmitted diseases (e.g., HIV, gonorrhea).    HOW TO USE:  Read the Patient Information Leaflet provided by your pharmacist or health care provider before the rod is placed. Read and sign the Informed Consent provided by your doctor. You will also be given a User Card with the date and the place on your body  where the rod was inserted. Keep the card and use it to remind yourself when to schedule an appointment to have the rod removed. If you have any questions, ask your doctor or pharmacist. Ask your doctor about the best time to schedule your appointment to have the rod placed. Your doctor may want you to have a pregnancy test first. The medication usually starts working right away when the rod is inserted on days 1 through 5 after the start of your regular menstrual bleeding. If your appointment is at another time in your menstrual cycle, you may need to use a non-hormonal form of birth control (e.g., condoms, diaphragm, spermicide) for the first 7 days after the rod is placed. Ask your doctor about whether you need back-up birth control. The rod will be inserted into the skin in your upper arm. Usually it will be placed in the arm opposite the side you write with. Be sure you can feel the rod underneath your skin after it has been placed. There will be 2 bandages covering the area where the rod is placed. Leave the top bandage on for 24 hours. Keep the smaller bandage on for 3-5 days or as directed by your doctor. Keep the bandage clean and dry.    SIDE EFFECTS:  Nausea, stomach cramping/bloating, dizziness, headache, breast tenderness, acne, hair loss, weight gain, and vaginal irritation/discharge may occur. Pain, bruising, numbness, infection, and scarring may occur at the site where the rod is placed. If any of these effects persist or worsen, notify your  doctor promptly. Your periods may be early or late, shorter or longer, heavier or lighter than normal. You may also have some spotting between periods, especially during the first several months of use. If bleeding is prolonged (more than 8 days) or unusually heavy, contact your doctor. If you miss 2 periods in a row, contact your doctor for a pregnancy test. Remember that your doctor has prescribed this medication because he or she has judged that the benefit  to you is greater than the risk of side effects. Many people using this medication do not have serious side effects. The rod must be removed after 3 years. This is usually a simple procedure done in your doctor's office. Rarely (e.g., if the rod has been placed too deeply or can't be felt), the rod may require surgery to remove. Tell your doctor immediately if any of these unlikely but serious side effects occur: depression, unwanted facial/body hair. This medication may rarely cause serious (sometimes fatal) problems from blood clots (e.g., pulmonary embolism, stroke, heart attack). Seek immediate medical attention if you experience: sudden shortness of breath, chest/jaw/left arm pain, confusion, coughing up blood, sudden dizziness/fainting, pain/swelling/warmth in the groin/calf, tingling/weakness/numbness in the arms/legs, headaches that are different from those you may have experienced in the past (e.g., headaches with other symptoms such as vision changes/lack of coordination, existing migraines becoming worse, sudden/very severe headaches), slurred speech, weakness on one side of the body, vision problems/changes. Tell your doctor immediately if any of these rare but very serious side effects occur: severe stomach/abdominal/pelvic pain, lumps in the breast, unusual tiredness, dark urine, yellowing eyes/skin. A very serious allergic reaction to this drug is rare. However, seek immediate medical attention if you notice any symptoms of a serious allergic reaction, including: rash, itching/swelling (especially of the face/tongue/throat), severe dizziness, trouble breathing. This is not a complete list of possible side effects. If you notice other effects not listed above, contact your doctor or pharmacist. In the [place] - Call your doctor for medical advice about side effects. You may report side effects to FDA at 1-800-FDA-1088. In [place] - Call your doctor for medical advice about side effects. You may report side  effects to [facility] [place] at [phone].    PRECAUTIONS:  Before having the rod placed, tell your doctor or pharmacist if you are allergic to etonogestrel; or to other progestins; or to any anesthetics or antiseptics that might be used in the procedure; or if you have any other allergies. This product may contain inactive ingredients, which can cause allergic reactions or other problems. Talk to your pharmacist for more details. This medication should not be used if you have certain medical conditions. Before using this medicine, consult your doctor or pharmacist if you have: abnormal breast exam, breast cancer, heart attack/stroke/other blood clots (e.g., in the legs, eyes, lungs), liver problems (e.g., liver tumor, active liver disease), current or suspected pregnancy, unexplained vaginal bleeding. Before using this medication, tell your doctor or pharmacist your medical history, especially of: depression, high blood pressure, low levels of "good" cholesterol (HDL), diabetes, gall bladder disease, heart disease (e.g., chest pain), history of yellowing eyes/skin (jaundice) during pregnancy or while using birth control pills, migraine headaches, seizures, long periods of sitting or lying down (e.g., immobility such as being bedridden). Smoking cigarettes/using tobacco while using hormonal birth control (implant/pill/patch/ring) increases your risk of heart problems and stroke. Do not smoke. The risk of heart problems increases with age (especially in women over 47) and with frequent smoking (15 or  more cigarettes a day). Notify your doctor at least 4 weeks beforehand if you will be having surgery or will be confined to a chair/bed for a long time (e.g., a long plane flight). You may need to have this medication removed temporarily or take special precautions at these times while you are using this drug. The drug in this implant may cause blotchy, dark areas on your skin (melasma). Sunlight may intensify this  effect. If this occurs, avoid prolonged sun exposure, use a sunscreen, and wear protective clothing when outdoors. If you are nearsighted or wear contact lenses, you may develop vision problems or may have problems wearing your contact lenses. Contact your eye doctor if these problems occur. This product should not be used during pregnancy. If you become pregnant or think you may be pregnant, inform your doctor immediately. A certain serious pregnancy problem (ectopic pregnancy) may be more likely if you become pregnant while using this product. This medication passes into breast milk in small amounts. Consult your doctor before breast-feeding.    DRUG INTERACTIONS:  Your doctor or pharmacist may already be aware of any possible drug interactions and may be monitoring you for them. Do not start, stop, or change the dosage of any medicine before checking with your doctor or pharmacist first. This drug should not be used with the following medication because very serious interactions may occur: sodium tetradecyl sulfate. If you are currently using the medication listed above, tell your doctor or pharmacist before starting this medication. Before using this medication, tell your doctor or pharmacist of all prescription and nonprescription/herbal products you may use, especially of: azole antifungals (e.g., fluconazole, itraconazole). Certain drugs can decrease the effectiveness of birth control by decreasing the amount of birth control hormones in your system. This can result in pregnancy. These drugs include: aprepitant, bexarotene, bosentan, dapsone, felbamate, griseofulvin, certain HIV drugs (such as amprenavir, indinavir, nelfinavir, nevirapine, ritonavir, zidovudine), modafinil, phenylbutazone, rifamycins (e.g., rifampin), many seizure medications (e.g., barbiturates, carbamazepine, phenytoin, topiramate), St. John's wort. Consult your doctor or pharmacist for details, and ask if you should use additional  reliable birth control methods while taking any of the drugs listed above. This medication can affect the results of certain lab tests (e.g., sex-hormone-binding globulin, thyroid). Make sure laboratory personnel and all your doctors know you use this medication. This document does not contain all possible interactions. Therefore, before using this product, tell your doctor or pharmacist of all the products you use. Keep a list of all your medications with you, and share the list with your doctor and pharmacist.    OVERDOSE:  This medication may be harmful if swallowed. Overdose may occur if more than 1 rod is placed or the old rod is not removed when a new rod is placed. If overdose is suspected, contact your local poison control center or emergency room immediately. [place] residents can call the [place] National Poison Hotline at (437)737-3130. [place] residents can call a Technical sales engineer center.    NOTES:  Keep all laboratory and medical appointments. You should have regular complete physical exams including blood pressure, breast exam, pelvic exam, and screening for cervical cancer (Pap smear). Follow your doctor's instructions for examining your own breasts, and report any lumps immediately. Consult your doctor for more details.    MISSED DOSE:  The rod must be removed or replaced after 3 years.    STORAGE:  Store at room temperature at 77 degrees F (25 degrees C) away from light and moisture until insertion.  Brief storage between 59-86 degrees F (15-30 degrees C) is permitted. Do not store in the bathroom. Keep all medicines away from children and pets. Do not flush medications down the toilet or pour them into a drain unless instructed to do so. Properly discard this product when it is expired or no longer needed. Consult your pharmacist or local waste disposal company for more details about how to safely discard your product.    Information last revised September 2010 Copyright(c) 2010 [employer]     IMPORTANT: HOW TO USE THIS INFORMATION:  This is a summary and does NOT have all possible information about this product. This information does not assure that this product is safe, effective, or appropriate for you. This information is not individual medical advice and does not substitute for the advice of your health care professional. Always ask your health care professional for complete information about this product and your specific health needs.    LEVONORGESTREL-RELEASING IMPLANT - INTRAUTERINE (lee-voh-nor-JEST-rell)    COMMON BRAND NAME(S): Mirena    USES:  This product is a small, flexible device that is placed in the womb (uterus) to prevent pregnancy. It is used in women who desire reversible birth control that works for a long time (up to 5 years). The device works by slowly releasing a hormone (levonorgestrel) that is similar to a certain substance made by a woman's body. This product is only intended for women who have previously given birth and have only one sexual partner. It is not meant for women with a history of certain infections/conditions (e.g., pelvic inflammatory disease, sexually transmitted disease, a certain problem pregnancy called ectopic pregnancy). For more information, consult your doctor. The use of this medication device does not protect you or your partner against sexually transmitted diseases (e.g., HIV, gonorrhea). Carefully read all of the information provided by your doctor, and ask any questions you may have about this product or other birth control methods that may be right for you.    HOW TO USE:  Read the Patient Information Leaflet provided by your pharmacist before this medication device is inserted and each time it is re-inserted. The leaflet contains very important information about side effects and when it is important to call your doctor. If you have any questions, consult your doctor or pharmacist. This product is inserted into your  uterus by a properly trained health care professional, usually once every 5 years or as determined by your doctor. The medication in the device is slowly released into the body over a 5-year period. Have a follow-up appointment 4-12 weeks after insertion of this product to check that it is still correctly in place. If you still desire birth control after 5 years, the medication device may be replaced with a new one. The medication device may also be removed at any time by a properly trained health care professional. Learn all the instructions on how and when to check this product and its proper positioning in your body, and make sure you understand the problems that may occur with this product. See also Precautions section.    SIDE EFFECTS:  Irregular vaginal bleeding (e.g., spotting), cramps, headache, nausea, breast pain, acne, rash, hair loss, weight gain, or decreased interest in sex may occur. If any of these effects persist or worsen, tell your doctor promptly. Remember that your doctor has prescribed this medication device because he or she has judged that the benefit to you is greater than the risk of side effects.  Many people using this medication device do not have serious side effects. Tell your doctor immediately if any of these serious side effects occur: lack of menstrual period, unexplained fever, chills, trouble breathing, mental/mood changes (e.g., depression, nervousness), vaginal swelling/itching, painful intercourse. Tell your doctor immediately if any of these unlikely but serious side effects occur: migraine/severe headache, vomiting, tiredness, fast/pounding heartbeat. Tell your doctor immediately if any of these highly unlikely but very serious side effects occur: prolonged or heavy vaginal bleeding, unusual vaginal discharge/odor, vaginal sores, abdominal/pelvic pain or tenderness, lumps in the breast, yellowing eyes/skin, dark urine, persistent nausea, trouble urinating. A very serious  allergic reaction to this drug is rare. However, seek immediate medical attention if you notice any of the following symptoms of a serious allergic reaction: rash, itching/swelling (especially of the face/tongue/throat), severe dizziness, trouble breathing. This is not a complete list of possible side effects. If you notice other effects not listed above, contact your doctor or pharmacist. In the [place] - Call your doctor for medical advice about side effects. You may report side effects to FDA at 1-800-FDA-1088. In [place] - Call your doctor for medical advice about side effects. You may report side effects to [facility] [place] at [phone].    PRECAUTIONS:  Before using this medication device, tell your doctor or pharmacist if you are allergic to levonorgestrel, or to any other progestins (e.g., norethindrone, desogestrel); or if you have any other allergies. This product may contain inactive ingredients, which can cause allergic reactions or other problems. Talk to your pharmacist for more details. This medication device should not be used if you have certain medical conditions. Before using this product, consult your doctor or pharmacist if you have: current known or suspected pregnancy, previous ectopic pregnancy, uterus problems (e.g., cancer, endometriosis, fibroids, pelvic inflammatory disease-PID), other IUD (intrauterine device) still in place, vaginal problems (e.g., infection), breast cancer, liver disease/tumors, any condition that affects your immune system (e.g., AIDS, leukemia). Before using this product, tell your doctor your medical history, especially of: bleeding problems (e.g., menstrual changes, clotting problems), heart problems (e.g., congenital valve conditions), high blood pressure, migraine headaches, stroke, diabetes. If you have diabetes, this medication may make it harder to control your blood sugar levels. Monitor your blood sugar regularly as directed by your doctor. Tell your doctor  the results and any symptoms such as increased thirst/urination. Your anti-diabetic medication or diet may need to be adjusted. This medication device may sometimes come out by itself or move out of place. This may result in unwanted pregnancy or other problems. After each menstrual period, check to make sure it is in the right place. Talk to your doctor about how to check your device. If it comes out or you cannot feel its threads, call your doctor promptly, and use a backup birth control method such as condoms. If you or partner has any other sexual partners, this medication device may no longer be a good choice for pregnancy prevention. If you or your partner becomes HIV positive, or if you think you may have been exposed to any sexually transmitted disease, contact your doctor immediately. You should consider having this device removed. This medication device must not be used during pregnancy. If you become pregnant or think you may be pregnant, tell your doctor immediately. If you have just given birth and are not breast-feeding, or if you have had a pregnancy loss or abortion after the 3 months of pregnancy, wait at least 6 weeks (or as directed by your  doctor) before using this medication device. Consult your doctor about the problems that may occur during pregnancy while using this product. Levonorgestrel passes into breast milk. Consult your doctor before breast-feeding.    DRUG INTERACTIONS:  Your doctor or pharmacist may already be aware of any possible drug interactions and may be monitoring you for them. Do not start, stop, or change the dosage of any medicine before checking with them first. Before using this medication device, tell your doctor of all prescription and nonprescription medications you may use, especially of: "blood thinners" (e.g., warfarin), birth control taken by mouth or applied to the skin (patch), certain drug used for varicose vein treatment (sodium tetradecyl sulfate), drugs  that affect your immune response (e.g., corticosteroids such as prednisone). This document does not contain all possible interactions. Therefore, before using this product, tell your doctor or pharmacist of all the products you use. Keep a list of all your medications with you, and share the list with your doctor and pharmacist.    OVERDOSE:  Overdose with this medication is very unlikely because of the way the drug is released from this device. Consult your doctor or pharmacist for more information.    NOTES:  Do not share this medication with others. Keep all appointments with your doctor and the laboratory. You should have regular complete physical exams including blood pressure, breast exam, pelvic exam, and screening for cervical cancer (Pap smear). Follow your doctor's instructions for examining your own breasts, and report any lumps immediately. Consult your doctor for more details.    MISSED DOSE:  Not applicable.    STORAGE:  Before use, store at room temperature at 77 degrees F (25 degrees C) away from light and moisture. Brief storage between 59-86 degrees F (15-30 degrees C) is permitted. Keep all medications and medical devices away from children and pets. Do not flush medications down the toilet or pour them into a drain unless instructed to do so. Properly discard this product when it is expired or no longer needed. Consult your pharmacist or local waste disposal company for more details about how to safely discard your product.    Information last revised June 2010 Copyright(c) 2010 [employer].

## 2011-05-31 ENCOUNTER — Ambulatory Visit: Payer: Medicaid Other | Admitting: Obstetrics & Gynecology

## 2011-09-01 ENCOUNTER — Encounter (HOSPITAL_COMMUNITY): Payer: Self-pay | Admitting: *Deleted

## 2011-09-01 ENCOUNTER — Emergency Department (HOSPITAL_COMMUNITY)
Admission: EM | Admit: 2011-09-01 | Discharge: 2011-09-01 | Disposition: A | Discharge status: Home / Self Care | Payer: Self-pay | Attending: Emergency Medicine | Admitting: Emergency Medicine

## 2011-09-01 ENCOUNTER — Emergency Department (HOSPITAL_COMMUNITY): Payer: Self-pay

## 2011-09-01 DIAGNOSIS — R630 Anorexia: Secondary | ICD-10-CM | POA: Insufficient documentation

## 2011-09-01 DIAGNOSIS — K2970 Gastritis, unspecified, without bleeding: Secondary | ICD-10-CM | POA: Insufficient documentation

## 2011-09-01 DIAGNOSIS — R10811 Right upper quadrant abdominal tenderness: Secondary | ICD-10-CM | POA: Insufficient documentation

## 2011-09-01 DIAGNOSIS — R1011 Right upper quadrant pain: Secondary | ICD-10-CM | POA: Insufficient documentation

## 2011-09-01 DIAGNOSIS — R110 Nausea: Secondary | ICD-10-CM | POA: Insufficient documentation

## 2011-09-01 DIAGNOSIS — F17200 Nicotine dependence, unspecified, uncomplicated: Secondary | ICD-10-CM | POA: Insufficient documentation

## 2011-09-01 DIAGNOSIS — M549 Dorsalgia, unspecified: Secondary | ICD-10-CM | POA: Insufficient documentation

## 2011-09-01 LAB — CBC
HCT: 35.8 % — ABNORMAL LOW (ref 36.0–46.0)
Hemoglobin: 12 g/dL (ref 12.0–15.0)
MCH: 29.6 pg (ref 26.0–34.0)
MCV: 88.4 fL (ref 78.0–100.0)
RBC: 4.05 MIL/uL (ref 3.87–5.11)
WBC: 8.7 10*3/uL (ref 4.0–10.5)

## 2011-09-01 LAB — DIFFERENTIAL
Eosinophils Absolute: 0.1 10*3/uL (ref 0.0–0.7)
Eosinophils Relative: 1 % (ref 0–5)
Lymphocytes Relative: 37 % (ref 12–46)
Lymphs Abs: 3.2 10*3/uL (ref 0.7–4.0)
Monocytes Relative: 6 % (ref 3–12)

## 2011-09-01 LAB — COMPREHENSIVE METABOLIC PANEL
AST: 21 U/L (ref 0–37)
Albumin: 3.6 g/dL (ref 3.5–5.2)
BUN: 8 mg/dL (ref 6–23)
Calcium: 9.5 mg/dL (ref 8.4–10.5)
Chloride: 105 mEq/L (ref 96–112)
Creatinine, Ser: 0.63 mg/dL (ref 0.50–1.10)
Total Protein: 7.1 g/dL (ref 6.0–8.3)

## 2011-09-01 LAB — URINALYSIS, ROUTINE W REFLEX MICROSCOPIC
Ketones, ur: NEGATIVE mg/dL
Leukocytes, UA: NEGATIVE
Nitrite: NEGATIVE
Protein, ur: NEGATIVE mg/dL
Urobilinogen, UA: 0.2 mg/dL (ref 0.0–1.0)
pH: 7 (ref 5.0–8.0)

## 2011-09-01 LAB — LIPASE, BLOOD: Lipase: 28 U/L (ref 11–59)

## 2011-09-01 MED ORDER — SODIUM CHLORIDE 0.9 % IV BOLUS (SEPSIS)
1000.0000 mL | Freq: Once | INTRAVENOUS | Status: AC
  Administered 2011-09-01: 1000 mL via INTRAVENOUS

## 2011-09-01 MED ORDER — OMEPRAZOLE 20 MG PO CPDR: 20.0000 mg | DELAYED_RELEASE_CAPSULE | Freq: Every day | ORAL | Status: DC

## 2011-09-01 MED ORDER — ONDANSETRON HCL 4 MG/2ML IJ SOLN
4.0000 mg | Freq: Once | INTRAMUSCULAR | Status: AC
  Administered 2011-09-01: 4 mg via INTRAVENOUS
  Filled 2011-09-01: qty 2

## 2011-09-01 MED ORDER — MORPHINE SULFATE 4 MG/ML IJ SOLN
4.0000 mg | Freq: Once | INTRAMUSCULAR | Status: AC
  Administered 2011-09-01: 4 mg via INTRAVENOUS
  Filled 2011-09-01: qty 1

## 2011-09-01 MED ORDER — SODIUM CHLORIDE 0.9 % IV SOLN
Freq: Once | INTRAVENOUS | Status: AC
  Administered 2011-09-01: 19:00:00 via INTRAVENOUS

## 2011-09-01 NOTE — ED Notes (Signed)
Pt states "started having right side abd pain that goes into my back about 2 wks ago, is worse when I eat, the pain comes & goes"

## 2011-09-01 NOTE — ED Notes (Signed)
Discharge instructions reviewed with patient, rated pain 5/10, no other questions, pt discharged home

## 2011-09-01 NOTE — ED Provider Notes (Signed)
History     CSN: [id number]  Arrival date & time 09/01/11  1450   First MD Initiated Contact with Patient 09/01/11 1607      Chief Complaint  Patient presents with  . Back Pain  . Arm Injury    (Consider location/radiation/quality/duration/timing/severity/associated sxs/prior treatment) Patient is a 26 y.o. female presenting with abdominal pain. The history is provided by the patient.  Abdominal Pain The primary symptoms of the illness include abdominal pain and nausea. The primary symptoms of the illness do not include fever, fatigue, shortness of breath, vomiting, diarrhea or dysuria. The current episode started more than 2 days ago. The onset of the illness was gradual. The problem has been gradually worsening.  The illness is associated with eating. The patient states that she believes she is currently not pregnant. The patient has not had a change in bowel habit. Additional symptoms associated with the illness include anorexia and back pain. Symptoms associated with the illness do not include chills, diaphoresis, heartburn, constipation, urgency or frequency.   Pt states that about 2 weeks ago she began to have RUQ pain that radiates around into her back. It does not radiate to the shoulder. It is described as intermittent, and sharp and stabbing in nature. It worsens after meals. She has noted decreased appetite, but has been able to eat. Has had nausea but denies vomiting. Denies change in BM, fever. Denies vaginal d/c, unusual bleeding, urinary sx.  Hx of C-section but denies other hx abd surgery.  Past Medical History  Diagnosis Date  . Anemia   . Anxiety     treated with lexapro at age 51  . Pregnancy induced hypertension 2012    Past Surgical History  Procedure Date  . Cesarean section     Family History  Problem Relation Age of Onset  . Thyroid disease Mother   . Mental illness Mother     PTSD from husband's death  . Asthma Neg Hx   . Cancer Neg Hx   . Heart  disease Neg Hx   . Stroke Neg Hx     History  Substance Use Topics  . Smoking status: Current Everyday Smoker -- 0.5 packs/day    Types: Cigarettes  . Smokeless tobacco: Not on file  . Alcohol Use: No    OB History    Grav Para Term Preterm Abortions TAB SAB Ect Mult Living   1 1 1       1       Review of Systems  Constitutional: Positive for appetite change. Negative for fever, chills, diaphoresis, activity change, fatigue and unexpected weight change.  HENT: Negative.   Respiratory: Negative for cough, chest tightness and shortness of breath.   Cardiovascular: Negative for chest pain, palpitations and leg swelling.  Gastrointestinal: Positive for nausea, abdominal pain and anorexia. Negative for heartburn, vomiting, diarrhea, constipation, blood in stool, abdominal distention, anal bleeding and rectal pain.  Genitourinary: Negative for dysuria, urgency, frequency and difficulty urinating.  Musculoskeletal: Positive for back pain.    Allergies  Latex  Home Medications   Current Outpatient Rx  Name Route Sig Dispense Refill  . IBUPROFEN-DIPHENHYDRAMINE HCL 200-25 MG PO CAPS Oral Take 2 tablets by mouth every 8 (eight) hours as needed. For pain.      BP 115/65  Pulse 67  Temp(Src) 98.4 F (36.9 C) (Oral)  Resp 20  Wt 192 lb 3.9 oz (87.2 kg)  SpO2 100%  LMP 08/27/2011  Physical Exam  Nursing note and vitals  reviewed. Constitutional: She is oriented to person, place, and time. She appears well-developed and well-nourished. No distress.  HENT:  Head: Normocephalic and atraumatic.  Eyes: Conjunctivae and EOM are normal.  Neck: Normal range of motion.  Cardiovascular: Normal rate, regular rhythm and normal heart sounds.   Pulmonary/Chest: Effort normal and breath sounds normal. She exhibits no tenderness.  Abdominal: Soft. Bowel sounds are normal. She exhibits no distension. There is tenderness in the right upper quadrant. There is no rebound, no guarding, no CVA  tenderness and negative Murphy's sign.  Musculoskeletal: Normal range of motion.  Neurological: She is alert and oriented to person, place, and time.  Skin: Skin is warm and dry. No rash noted. She is not diaphoretic.  Psychiatric: She has a normal mood and affect.    ED Course  Procedures (including critical care time)  Labs Reviewed  COMPREHENSIVE METABOLIC PANEL - Abnormal; Notable for the following:    Total Bilirubin 0.2 (*)    All other components within normal limits  CBC - Abnormal; Notable for the following:    HCT 35.8 (*)    All other components within normal limits  URINALYSIS, ROUTINE W REFLEX MICROSCOPIC  DIFFERENTIAL  LIPASE, BLOOD  POCT PREGNANCY, URINE  POCT PREGNANCY, URINE   US Abdomen Complete  09/01/2011  *RADIOLOGY REPORT*  Clinical Data:  Right upper quadrant abdominal pain for 2 weeks  ULTRASOUND ABDOMEN:  Technique:  Sonography of upper abdominal structures was performed.  Comparison:  None  Gallbladder:  Normally distended without stones or wall thickening. No pericholecystic fluid or sonographic Murphy sign.  Common bile duct:  Normal caliber 3 mm diameter  Liver:  Normal appearance  IVC:  Normal appearance  Pancreas:  Small portion of pancreatic body and proximal tail is visualized and is normal appearance, remainder obscured by bowel gas  Spleen:  Normal appearance, 7.8 cm length  Right kidney:  11.8 cm length. Lobular appearance of cortex of lower pole.  Normal cortical thickness echogenicity.  No mass or hydronephrosis.  No shadowing calcifications.  Left kidney:  10.5 cm length. Normal morphology without mass or hydronephrosis.  Aorta:  Normal appearance  Other:  No free fluid  IMPRESSION: No acute abnormalities.  Original Report Authenticated By: Lollie Marrow, M.D.     1. Gastritis       MDM  Pt's labs unremarkable; no evidence for cholelithiasis or cholecystitis on abd u/s. Will give home trial of PPI. Findings d/w pt and she was instructed to make  f/u with PCP. Return precautions discussed.        Grant Fontana, [place] 09/01/11 1948

## 2011-09-01 NOTE — ED Notes (Signed)
Pt alert and oriented x4. Respirations even and unlabored, bilateral symmetrical rise and fall of chest. Skin warm and dry. In no acute distress. Denies needs.   

## 2011-09-02 NOTE — ED Provider Notes (Signed)
Medical screening examination/treatment/procedure(s) were performed by non-physician practitioner and as supervising physician I was immediately available for consultation/collaboration.   Nat Christen, MD 09/02/11 [phone]

## 2011-12-25 IMAGING — US US OB FOLLOW-UP
1 series · 13 of 28 positions shown · non-contrast
Comparison: none

[Series 1: us ob follow up · 0.25mm/px · 13 of 35 slices shown]
[im 2/35]
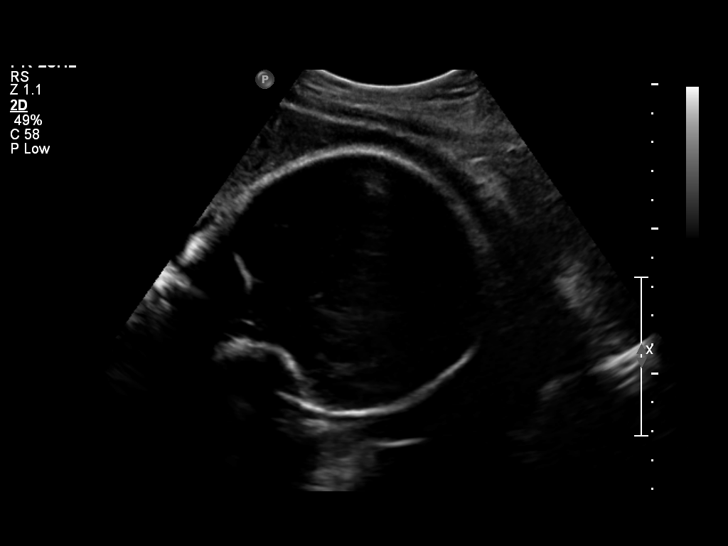
[im 4/35]
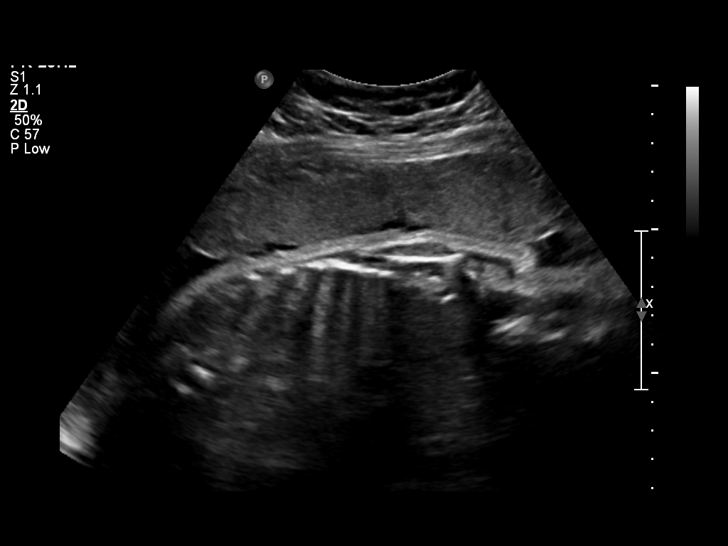
[im 7/35]
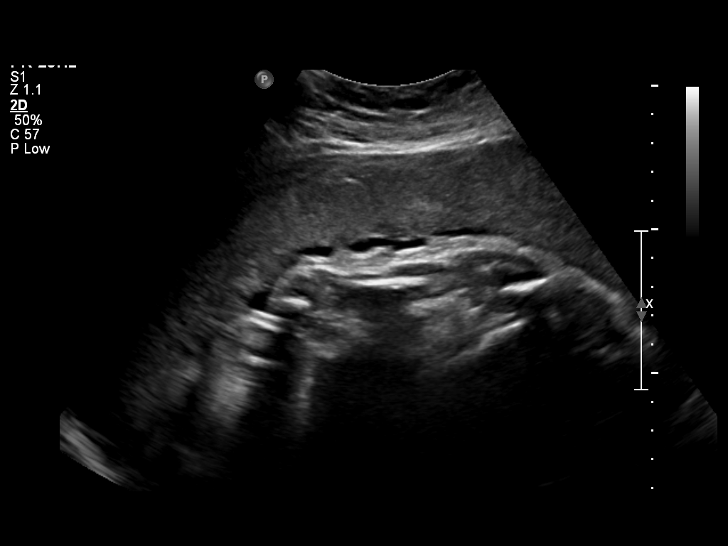
[im 9/35]
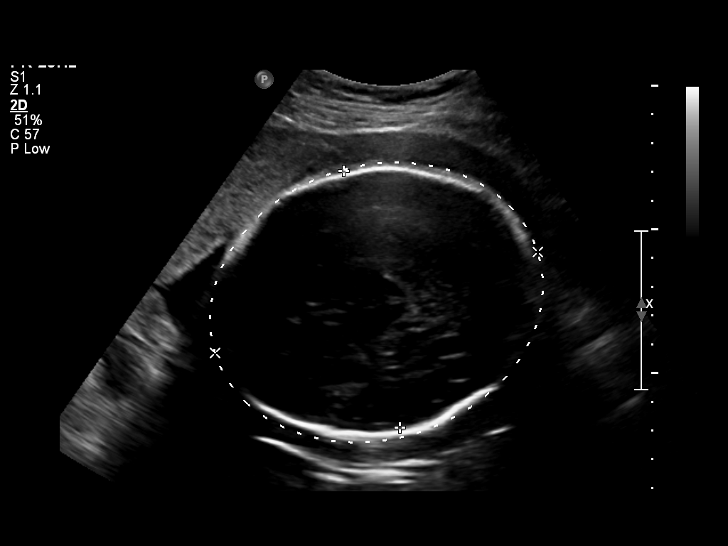
[im 12/35]
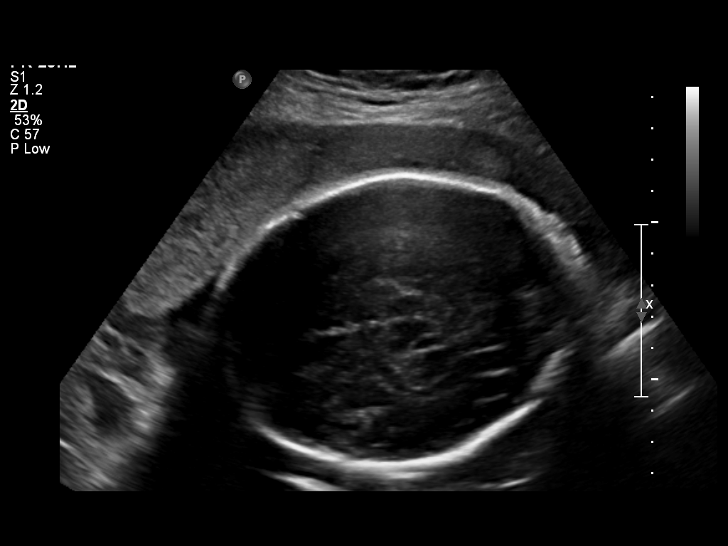
[im 14/35]
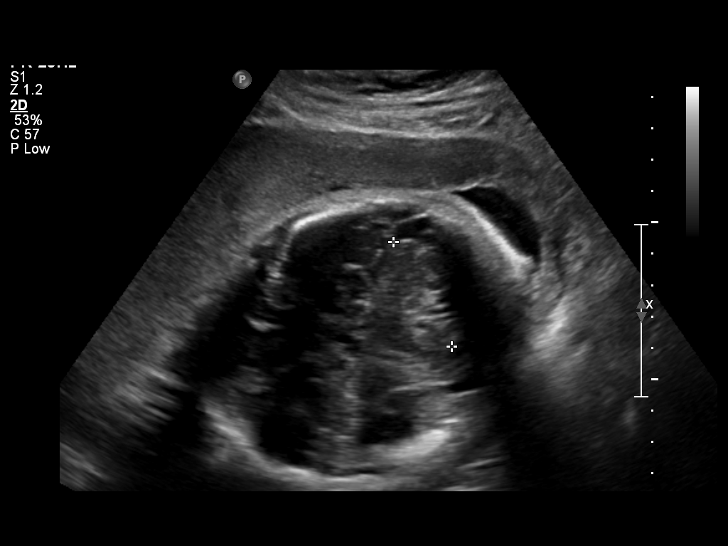
[im 18/35]
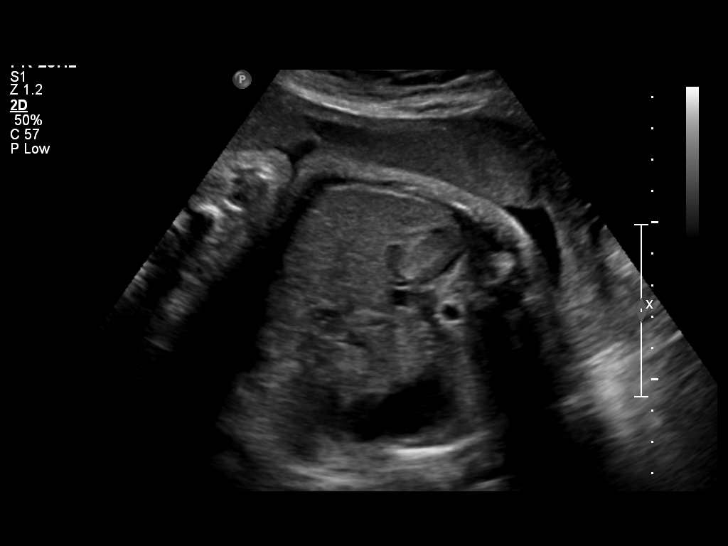
[im 21/35]
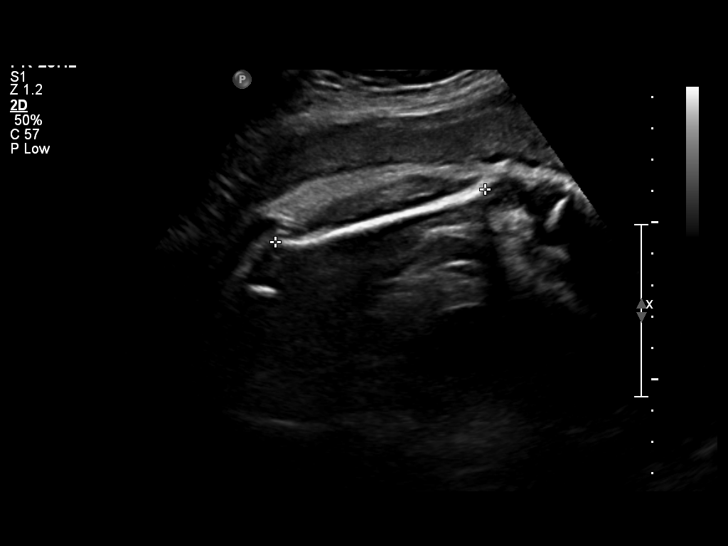
[im 23/35]
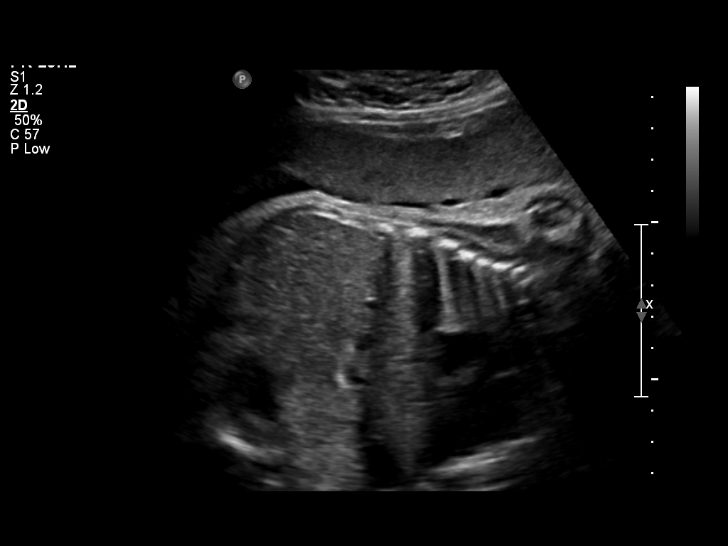
[im 26/35]
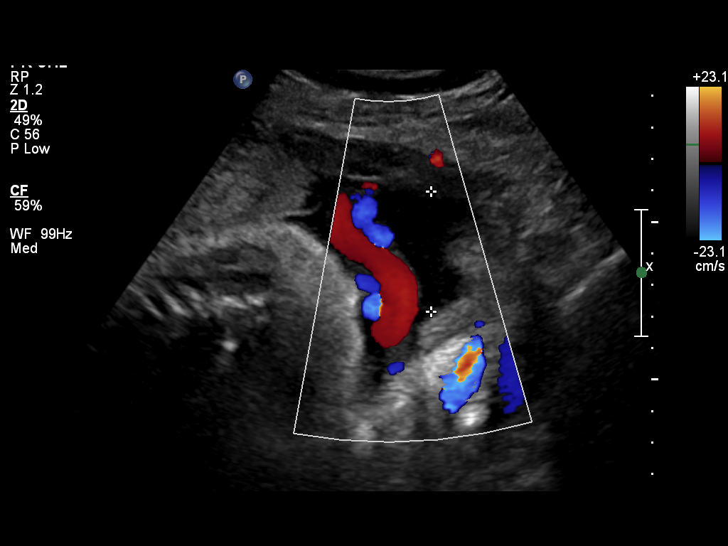
[im 28/35]
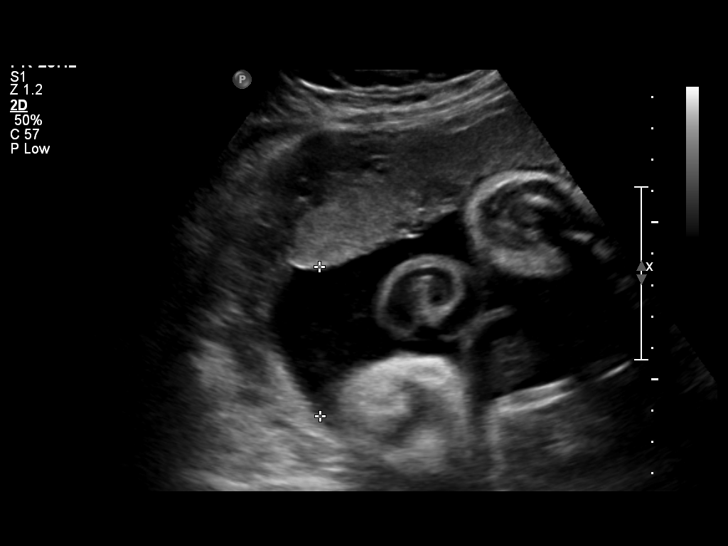
[im 31/35]
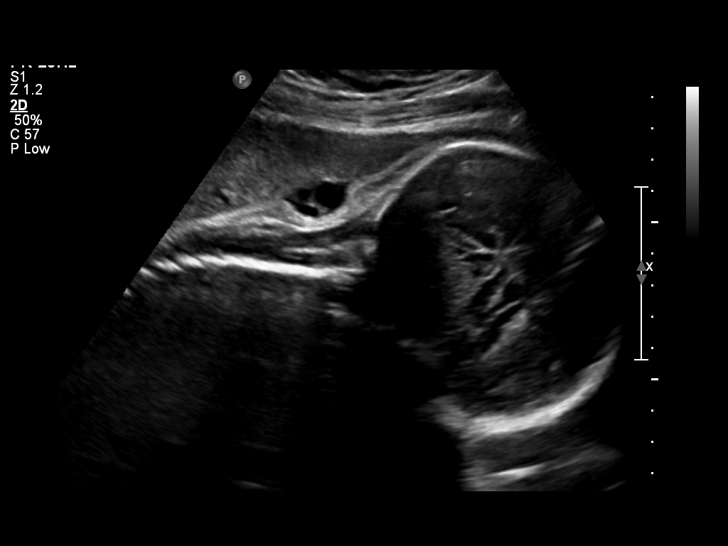
[im 33/35]
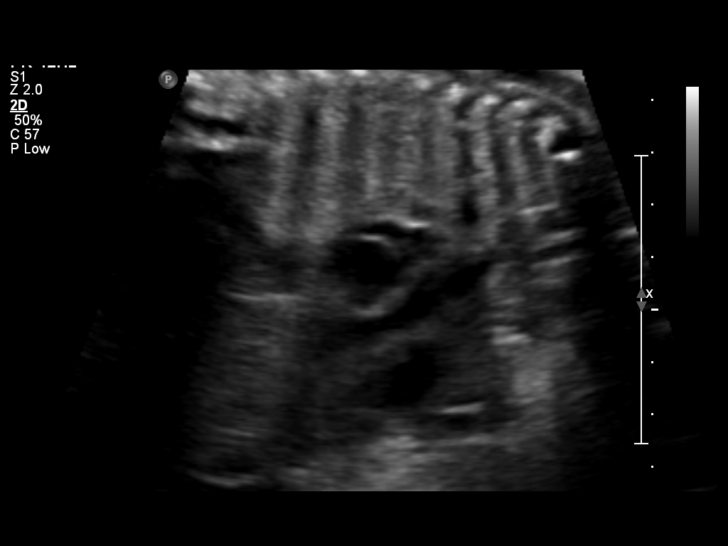

[13 of 28 positions shown; findings below may reference images not displayed]

OBSTETRICS REPORT
                      (Signed Final 07/11/2010 [DATE])

                 Hospital Clinic-
                 Faculty Physician
 Order#:         _O
Procedures

 US OB FOLLOW UP                                       76816.1
Indications

 Assess Fetal Growth / Estimated Fetal Weight
 Hypertension - Gestational
Fetal Evaluation

 Fetal Heart Rate:  147                          bpm
 Cardiac Activity:  Observed
 Presentation:      Cephalic
 Placenta:          Anterior, above cervical os
 P. Cord            Previously Visualized
 Insertion:

 Amniotic Fluid
 AFI FV:      Subjectively within normal limits
 AFI Sum:     16.32   cm       59  %Tile     Larg Pckt:    4.73  cm
 RUQ:   4.73    cm   RLQ:    3.87   cm    LUQ:   3.8     cm   LLQ:    3.92   cm
Biometry

 BPD:     89.8  mm     G. Age:  36w 3d                CI:         73.2   70 - 86
                                                      FL/HC:      20.2   19.4 -

 HC:     333.6  mm     G. Age:  38w 1d     > 97  %    HC/AC:      1.02   0.96 -

 AC:     327.6  mm     G. Age:  36w 5d     > 97  %    FL/BPD:     75.2   71 - 87
 FL:      67.5  mm     G. Age:  34w 5d       62  %    FL/AC:      20.6   20 - 24

 Est. FW:    5775  gm      6 lb 6 oz   > 90  %
Gestational Age

 U/S Today:     36w 3d                                        EDD:   08/05/10
 Best:          33w 6d     Det. By:  U/S C R L (02/11/10)     EDD:   08/23/10
Anatomy
 Cranium:           Appears normal      Aortic Arch:       Previously seen
 Fetal Cavum:       Previously seen     Ductal Arch:       Previously seen
 Ventricles:        Appears normal      Diaphragm:         Appears normal
 Choroid Plexus:    Previously seen     Stomach:           Appears
                                                           normal, left
                                                           sided
 Cerebellum:        Appears normal      Abdomen:           Appears normal
 Posterior Fossa:   Previously seen     Abdominal Wall:    Previously seen
 Nuchal Fold:       Previously seen     Cord Vessels:      Previously seen
 Face:              Previously seen     Kidneys:           Appear normal
 Heart:             Previously seen     Bladder:           Appears normal
 RVOT:              Previously seen     Spine:             Previously seen
 LVOT:              Appears normal      Limbs:             Previously seen

 Other:     Heels and 5th digit previously seen. Nasal bone
            previously visualized. Fetus appears to be a male.
Cervix Uterus Adnexa

 Cervix:       No adaquately visualized
Impression

 Single live IUP in cephalic presentation.  Measuring nearly 3
 weeks ahead of assigned GA by US, EFW >90%ile.
 No late-developing anomaly in visualized structures above.

 questions or concerns.

## 2012-01-22 ENCOUNTER — Encounter (HOSPITAL_COMMUNITY): Payer: Self-pay

## 2012-01-22 ENCOUNTER — Inpatient Hospital Stay (HOSPITAL_COMMUNITY): Payer: 59

## 2012-01-22 ENCOUNTER — Inpatient Hospital Stay (HOSPITAL_COMMUNITY)
Admission: AD | Admit: 2012-01-22 | Discharge: 2012-01-22 | Disposition: A | Discharge status: Home / Self Care | Payer: 59 | Source: Ambulatory Visit | Attending: Obstetrics and Gynecology | Admitting: Obstetrics and Gynecology

## 2012-01-22 DIAGNOSIS — M549 Dorsalgia, unspecified: Secondary | ICD-10-CM | POA: Insufficient documentation

## 2012-01-22 DIAGNOSIS — N200 Calculus of kidney: Secondary | ICD-10-CM

## 2012-01-22 DIAGNOSIS — R109 Unspecified abdominal pain: Secondary | ICD-10-CM | POA: Insufficient documentation

## 2012-01-22 DIAGNOSIS — O99891 Other specified diseases and conditions complicating pregnancy: Secondary | ICD-10-CM | POA: Insufficient documentation

## 2012-01-22 DIAGNOSIS — N209 Urinary calculus, unspecified: Secondary | ICD-10-CM | POA: Insufficient documentation

## 2012-01-22 DIAGNOSIS — O2302 Infections of kidney in pregnancy, second trimester: Secondary | ICD-10-CM

## 2012-01-22 LAB — CBC
Hemoglobin: 11.1 g/dL — ABNORMAL LOW (ref 12.0–15.0)
MCH: 29.8 pg (ref 26.0–34.0)
MCV: 88.7 fL (ref 78.0–100.0)
Platelets: 234 10*3/uL (ref 150–400)
RBC: 3.72 MIL/uL — ABNORMAL LOW (ref 3.87–5.11)
WBC: 14.6 10*3/uL — ABNORMAL HIGH (ref 4.0–10.5)

## 2012-01-22 LAB — URINALYSIS, ROUTINE W REFLEX MICROSCOPIC
Bilirubin Urine: NEGATIVE
Nitrite: NEGATIVE
Specific Gravity, Urine: >1.03 — ABNORMAL HIGH (ref 1.005–1.030)
Urobilinogen, UA: 0.2 mg/dL (ref 0.0–1.0)
pH: 6 (ref 5.0–8.0)

## 2012-01-22 LAB — URINE MICROSCOPIC-ADD ON

## 2012-01-22 IMAGING — US US OB FOLLOW-UP
1 series · 13 of 27 positions shown · non-contrast
Comparison: none

[Series 1: us ob follow up · 27 acquisitions, 13 frames shown]
[im 2/27]
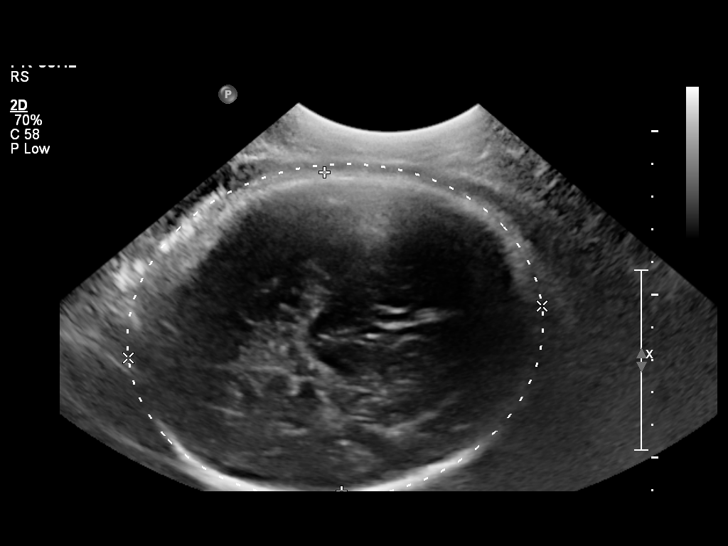
[im 4/27]
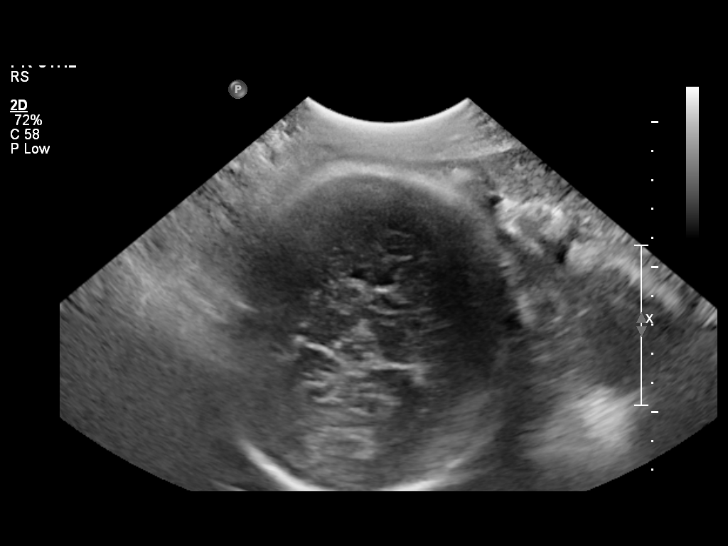
[im 6/27]
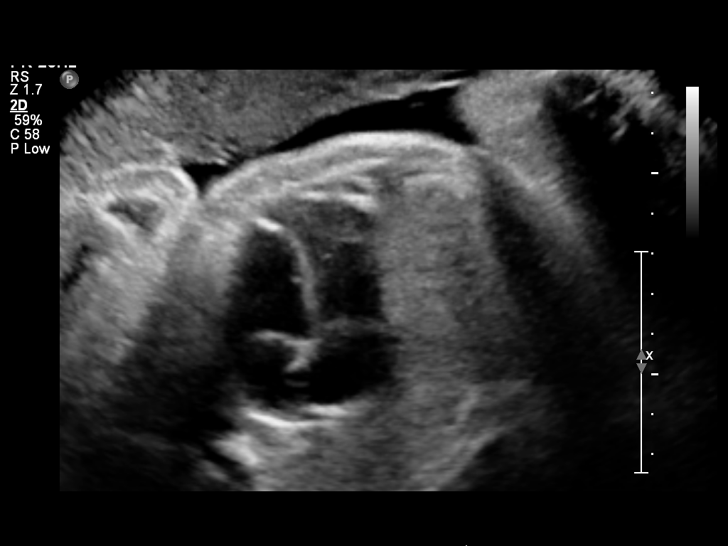
[im 8/27]
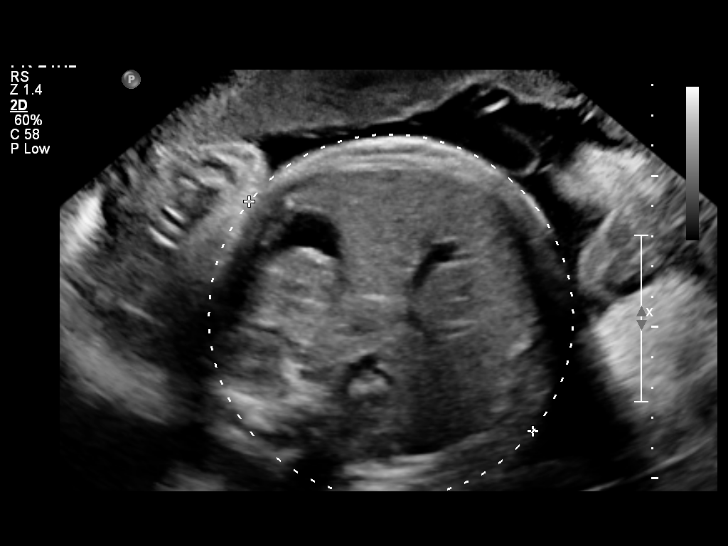
[im 10/27]
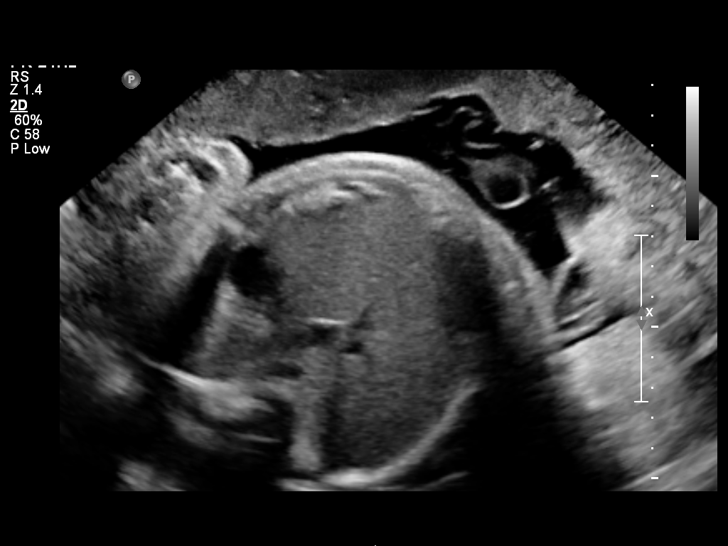
[im 12/27]
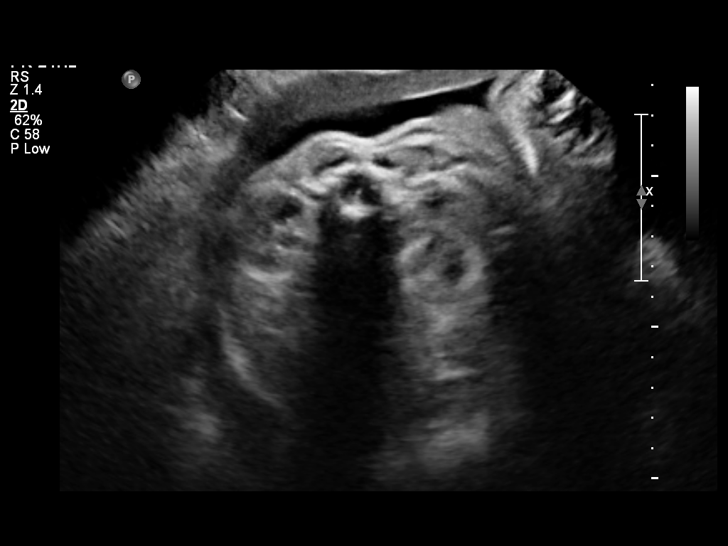
[im 14/27]
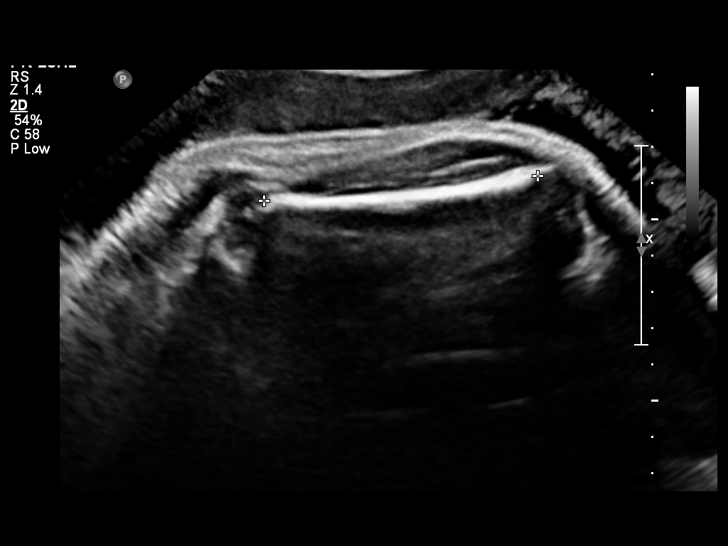
[im 16/27]
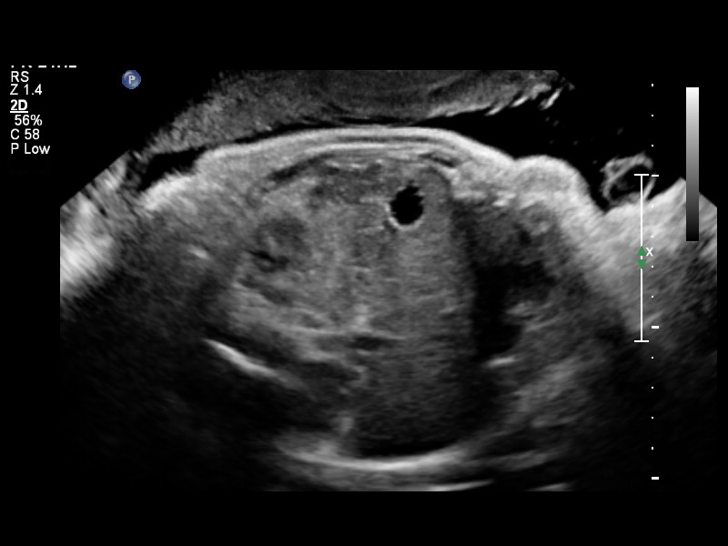
[im 18/27]
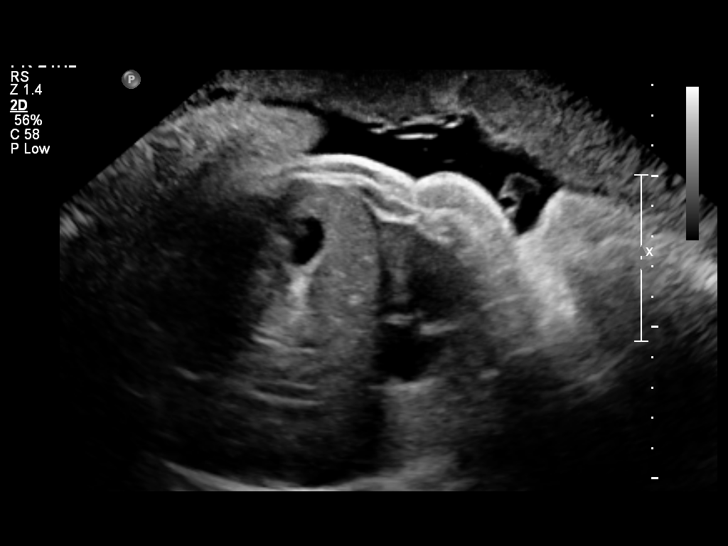
[im 20/27]
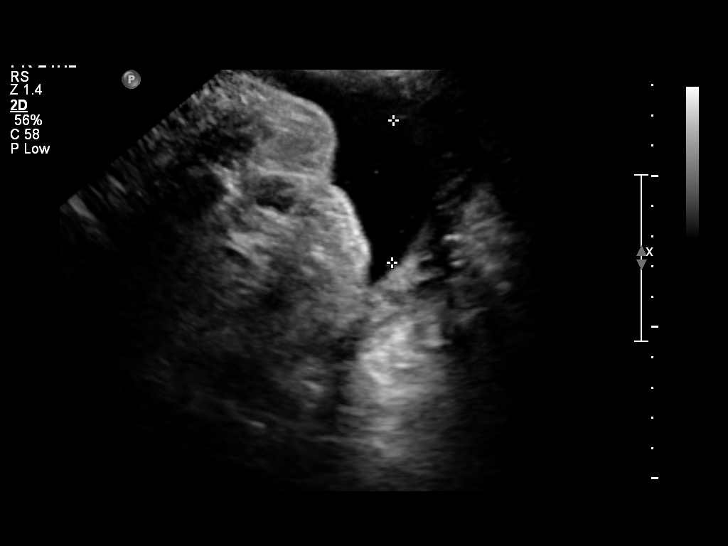
[im 22/27]
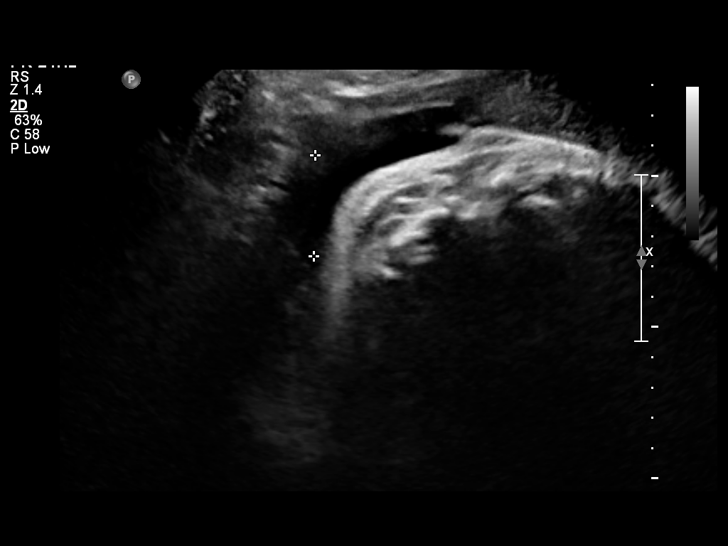
[im 24/27]
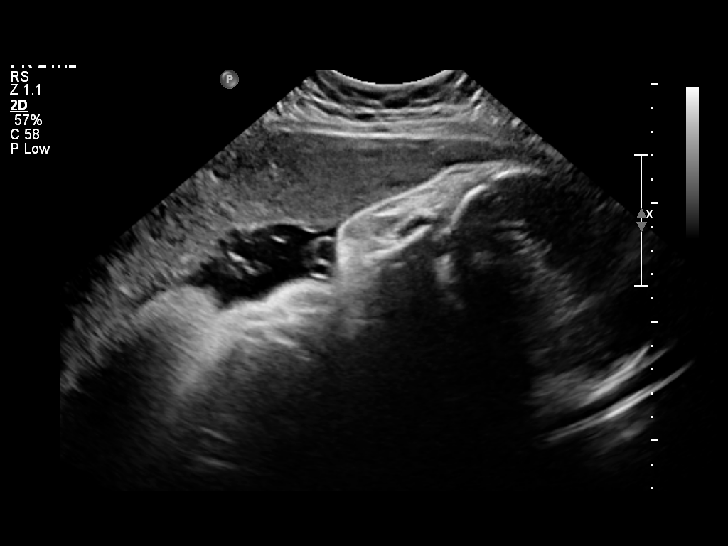
[im 26/27]
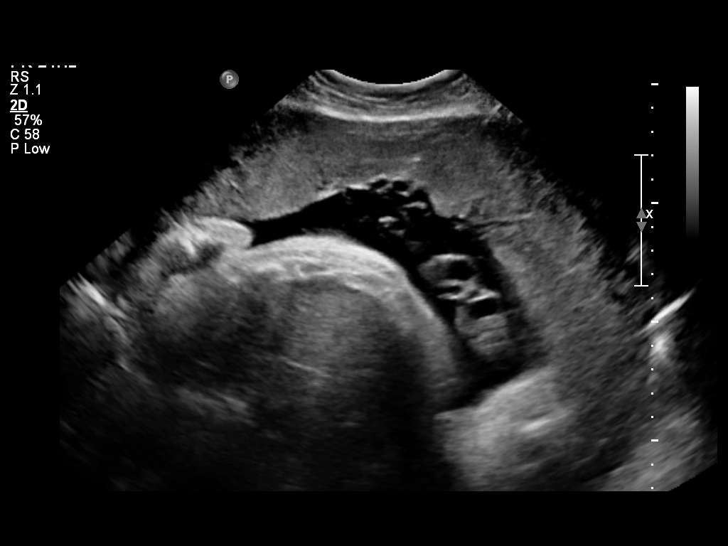

[13 of 27 positions shown; findings below may reference images not displayed]

OBSTETRICS REPORT
                      (Signed Final 08/08/2010 [DATE])

                 Hospital Clinic-
                 Faculty Physician
 Order#:         _O
Procedures

 US OB FOLLOW UP                                       76816.1
Indications

 Assess Fetal Growth / Estimated Fetal Weight
 Size greater than dates (Large for gestational [AGE]
 Hypertension - Gestational
Fetal Evaluation

 Fetal Heart Rate:  144                          bpm
 Cardiac Activity:  Observed
 Presentation:      Cephalic
 Placenta:          Anterior, above cervical os
 P. Cord            Previously Visualized
 Insertion:

 Amniotic Fluid
 AFI FV:      Subjectively within normal limits
 AFI Sum:     15.04   cm       58  %Tile     Larg Pckt:    4.68  cm
 RUQ:   4.42    cm   RLQ:    4.68   cm    LUQ:   2.62    cm   LLQ:    3.32   cm
Biometry

 BPD:     96.7  mm     G. Age:  39w 4d                CI:         72.2   70 - 86
                                                      FL/HC:      20.6   20.9 -

 HC:     362.1  mm     G. Age:  N/A        > 97  %    HC/AC:      0.97   0.92 -

 AC:     374.6  mm     G. Age:  41w 3d     > 97  %    FL/BPD:     77.2   71 - 87
 FL:      74.7  mm     G. Age:  38w 2d       61  %    FL/AC:      19.9   20 - 24

 Est. FW:    5262  gm      9 lb 3 oz   > 90  %
Gestational Age

 U/S Today:     39w 5d                                        EDD:   08/10/10
 Best:          37w 6d     Det. By:  U/S C R L (02/11/10)     EDD:   08/23/10
Anatomy
 Cranium:           Appears normal      Aortic Arch:       Previously seen
 Fetal Cavum:       Previously seen     Ductal Arch:       Previously seen
 Ventricles:        Appears normal      Diaphragm:         Appears normal
 Choroid Plexus:    Previously seen     Stomach:           Appears
                                                           normal, left
                                                           sided
 Cerebellum:        Previously seen     Abdomen:           Appears normal
 Posterior Fossa:   Previously seen     Abdominal Wall:    Previously seen
 Nuchal Fold:       Previously seen     Cord Vessels:      Previously seen
 Face:              Previously seen     Kidneys:           Appear normal
 Heart:             Previously seen     Bladder:           Appears normal
 RVOT:              Previously seen     Spine:             Previously seen
 LVOT:              Previously seen     Limbs:             Previously seen

 Other:     Heels and 5th digit previously seen. Nasal bone
            previously visualized. Fetus appears to be a male.
Cervix Uterus Adnexa

 Cervix:       Not visualized (advanced GA >34 wks)
Impression

 Assigned GA is currently 37w 6d.  Fetus is large-for-
 gestational-age, with further increase on growth curve above
 90 %ile (EFW of 3686gm).
 Amniotic fluid within normal limits, with AFI of 15.04 cm.

 questions or concerns.

## 2012-01-22 MED ORDER — CEPHALEXIN 500 MG PO CAPS: 500.0000 mg | ORAL_CAPSULE | Freq: Four times a day (QID) | ORAL | Status: AC

## 2012-01-22 MED ORDER — PHENAZOPYRIDINE HCL 100 MG PO TABS
200.0000 mg | ORAL_TABLET | Freq: Once | ORAL | Status: AC
  Administered 2012-01-22: 200 mg via ORAL
  Filled 2012-01-22 (×2): qty 1

## 2012-01-22 MED ORDER — PHENAZOPYRIDINE HCL 200 MG PO TABS: 200.0000 mg | ORAL_TABLET | Freq: Three times a day (TID) | ORAL | Status: AC

## 2012-01-22 MED ORDER — LACTATED RINGERS IV BOLUS (SEPSIS)
1000.0000 mL | Freq: Once | INTRAVENOUS | Status: AC
  Administered 2012-01-22: 1000 mL via INTRAVENOUS

## 2012-01-22 MED ORDER — OXYCODONE-ACETAMINOPHEN 5-325 MG PO TABS
2.0000 | ORAL_TABLET | Freq: Once | ORAL | Status: AC
  Administered 2012-01-22: 2 via ORAL
  Filled 2012-01-22: qty 2

## 2012-01-22 MED ORDER — HYDROMORPHONE HCL PF 1 MG/ML IJ SOLN
2.0000 mg | Freq: Once | INTRAMUSCULAR | Status: AC
  Administered 2012-01-22: 2 mg via INTRAVENOUS
  Filled 2012-01-22: qty 2

## 2012-01-22 MED ORDER — OXYCODONE-ACETAMINOPHEN 5-325 MG PO TABS: 2.0000 | ORAL_TABLET | ORAL | Status: AC | PRN

## 2012-01-22 MED ORDER — SODIUM CHLORIDE 0.9 % IJ SOLN
INTRAMUSCULAR | Status: AC
  Filled 2012-01-22: qty 3

## 2012-01-22 MED ORDER — CEPHALEXIN 500 MG PO CAPS
500.0000 mg | ORAL_CAPSULE | Freq: Once | ORAL | Status: AC
  Administered 2012-01-22: 500 mg via ORAL
  Filled 2012-01-22: qty 1

## 2012-01-22 NOTE — MAU Note (Signed)
Pt states pain in lower right back, that is a crushing pain that won't go away.

## 2012-01-22 NOTE — MAU Provider Note (Signed)
Tracey L Austin25 y.o.G2P1001 @[redacted]w[redacted]d  by LMP Chief Complaint  Patient presents with  . Back Pain     First Provider Initiated Contact with Patient 01/22/12 1605      SUBJECTIVE  HPI: HPI: Tracey Chen is a 26 y.o. year old G27P1001 female at [redacted]w[redacted]d weeks gestation who presents to MAU reporting severe right flank pain. She denies fever, chills, GU or GI complaints. She states she has never had pain like this before.    Past Medical History  Diagnosis Date  . Anemia   . Anxiety     treated with lexapro at age 55  . Pregnancy induced hypertension 2012   Past Surgical History  Procedure Date  . Cesarean section    History   Social History  . Marital Status: Single    Spouse Name: N/A    Number of Children: N/A  . Years of Education: N/A   Occupational History  . Not on file.   Social History Main Topics  . Smoking status: Current Everyday Smoker -- 0.5 packs/day    Types: Cigarettes  . Smokeless tobacco: Not on file  . Alcohol Use: No  . Drug Use: No  . Sexually Active: Yes -- Female partner(s)    Birth Control/ Protection: Injection   Other Topics Concern  . Not on file   Social History Narrative   Lives with son Tracey Chen (2012), fiance Tracey Chen, and fiancee's parents Tracey Chen and Tracey Chen.  High schoool education, never graduated.  2 black labs as pets.     No current facility-administered medications on file prior to encounter.   Current Outpatient Prescriptions on File Prior to Encounter  Medication Sig Dispense Refill  . omeprazole (PRILOSEC) 20 MG capsule Take 1 capsule (20 mg total) by mouth daily.  30 capsule  3   Allergies  Allergen Reactions  . Latex Rash    ROS: Pertinent items in HPI  OBJECTIVE Blood pressure 117/66, pulse 88, temperature 98.3 F (36.8 C), temperature source Oral, resp. rate 20, height 5\' 3"  (1.6 m), weight 84.55 kg (186 lb 6.4 oz), last menstrual period 08/27/2011, SpO2 100.00%.  GENERAL: Well-developed, well-nourished  female in moderate distress.  HEENT: Normocephalic HEART: normal rate RESP: normal effort ABDOMEN: Soft, nontender, gravid, S=D BACK: Mild right CVAT EXTREMITIES: Nontender, no edema NEURO: Alert and oriented SPECULUM EXAM: Deferred FHR 150  LAB RESULTS Results for orders placed during the hospital encounter of 01/22/12 (from the past 24 hour(s))  URINALYSIS, ROUTINE W REFLEX MICROSCOPIC     Status: Abnormal   Collection Time   01/22/12  3:45 PM      Component Value Range   Color, Urine YELLOW  YELLOW    APPearance HAZY (*) CLEAR    Specific Gravity, Urine >1.030 (*) 1.005 - 1.030    pH 6.0  5.0 - 8.0    Glucose, UA NEGATIVE  NEGATIVE (mg/dL)   Hgb urine dipstick LARGE (*) NEGATIVE    Bilirubin Urine NEGATIVE  NEGATIVE    Ketones, ur 40 (*) NEGATIVE (mg/dL)   Protein, ur NEGATIVE  NEGATIVE (mg/dL)   Urobilinogen, UA 0.2  0.0 - 1.0 (mg/dL)   Nitrite NEGATIVE  NEGATIVE    Leukocytes, UA TRACE (*) NEGATIVE   URINE MICROSCOPIC-ADD ON     Status: Abnormal   Collection Time   01/22/12  3:45 PM      Component Value Range   Squamous Epithelial / LPF FEW (*) RARE    WBC, UA 11-20  <3 (WBC/hpf)  RBC / HPF 21-50  <3 (RBC/hpf)   Bacteria, UA FEW (*) RARE    Urine-Other MUCOUS PRESENT       IMAGING  1620: IV bolus, Dilaudid and renal US per Dr. Tenny Craw. Significant improvement in pain after Dilaudid. Reports increased urgency and frequency since arrival to MAU. Unsure if this is due to IV fluids or original complaint. 1840: Reviewed [place] results w/ Dr. Tenny Craw. Offered admit for pain management vs D/C home w/ oral meds. Pt will try oral meds. Requests first dose prior to D/C. Percocet, Keflex and pyridium given.    ASSESSMENT 1. Kidney calculus vs  2. Pyelonephritis complicating pregnancy, second trimester    PLAN D/C home  Follow-up Information    Follow up with Almon Hercules., MD. (as scheduled in 2 days)    Contact information:   [address] [place]  [place] [address] [phone]       Follow up with [facility]. (As needed if symptoms worsen)    Contact information:   [address] [place] [address] [phone]        Medication List  As of 01/23/2012  1:38 PM   START taking these medications         cephALEXin 500 MG capsule   Commonly known as: KEFLEX   Take 1 capsule (500 mg total) by mouth 4 (four) times daily.      oxyCODONE-acetaminophen 5-325 MG per tablet   Commonly known as: PERCOCET   Take 2 tablets by mouth every 4 (four) hours as needed for pain.      phenazopyridine 200 MG tablet   Commonly known as: PYRIDIUM   Take 1 tablet (200 mg total) by mouth 3 (three) times daily.         CONTINUE taking these medications         omeprazole 20 MG capsule   Commonly known as: PRILOSEC   Take 1 capsule (20 mg total) by mouth daily.      ondansetron 8 MG tablet   Commonly known as: ZOFRAN      prenatal multivitamin Tabs      pyridOXINE 100 MG tablet   Commonly known as: VITAMIN B-6          Where to get your medications    These are the prescriptions that you need to pick up.   You may get these medications from any pharmacy.         cephALEXin 500 MG capsule   oxyCODONE-acetaminophen 5-325 MG per tablet   phenazopyridine 200 MG tablet          Urine culture pending Push PO fluids Return to MAU ASAP if urinary retention occurs  Dorathy Kinsman 01/22/2012 4:17 PM

## 2012-01-22 NOTE — MAU Note (Signed)
Patient states she started having right flank pain about 2 hours ago. Denies any bleeding or leaking.

## 2012-01-22 NOTE — Discharge Instructions (Signed)
Kidney Stones Kidney stones (ureteral lithiasis) are deposits that form inside your kidneys. The intense pain is caused by the stone moving through the urinary tract. When the stone moves, the ureter goes into spasm around the stone. The stone is usually passed in the urine.  CAUSES   A disorder that makes certain neck glands produce too much parathyroid hormone (primary hyperparathyroidism).   A buildup of uric acid crystals.   Narrowing (stricture) of the ureter.   A kidney obstruction present at birth (congenital obstruction).   Previous surgery on the kidney or ureters.   Numerous kidney infections.  SYMPTOMS   Feeling sick to your stomach (nauseous).   Throwing up (vomiting).   Blood in the urine (hematuria).   Pain that usually spreads (radiates) to the groin.   Frequency or urgency of urination.  DIAGNOSIS   Taking a history and physical exam.   Blood or urine tests.   Computerized X-ray scan (CT scan).   Occasionally, an examination of the inside of the urinary bladder (cystoscopy) is performed.  TREATMENT   Observation.   Increasing your fluid intake.   Surgery may be needed if you have severe pain or persistent obstruction.  The size, location, and chemical composition are all important variables that will determine the proper choice of action for you. Talk to your caregiver to better understand your situation so that you will minimize the risk of injury to yourself and your kidney.  HOME CARE INSTRUCTIONS   Drink enough water and fluids to keep your urine clear or pale yellow.   Strain all urine through the provided strainer. Keep all particulate matter and stones for your caregiver to see. The stone causing the pain may be as small as a grain of salt. It is very important to use the strainer each and every time you pass your urine. The collection of your stone will allow your caregiver to analyze it and verify that a stone has actually passed.   Only take  over-the-counter or prescription medicines for pain, discomfort, or fever as directed by your caregiver.   Make a follow-up appointment with your caregiver as directed.   Get follow-up X-rays if required. The absence of pain does not always mean that the stone has passed. It may have only stopped moving. If the urine remains completely obstructed, it can cause loss of kidney function or even complete destruction of the kidney. It is your responsibility to make sure X-rays and follow-ups are completed. Ultrasounds of the kidney can show blockages and the status of the kidney. Ultrasounds are not associated with any radiation and can be performed easily in a matter of minutes.  SEEK IMMEDIATE MEDICAL CARE IF:   Pain cannot be controlled with the prescribed medicine.   You have a fever.   The severity or intensity of pain increases over 18 hours and is not relieved by pain medicine.   You develop a new onset of abdominal pain.   You feel faint or pass out.  MAKE SURE YOU:   Understand these instructions.   Will watch your condition.   Will get help right away if you are not doing well or get worse.  Document Released: 08/07/2005 Document Revised: 07/27/2011 Document Reviewed: 12/03/2009 [employer] Patient Information 2012 [employer].Kidney Stones Kidney stones (ureteral lithiasis) are deposits that form inside your kidneys. The intense pain is caused by the stone moving through the urinary tract. When the stone moves, the ureter goes into spasm around the stone. The   stone is usually passed in the urine.  CAUSES   A disorder that makes certain neck glands produce too much parathyroid hormone (primary hyperparathyroidism).   A buildup of uric acid crystals.   Narrowing (stricture) of the ureter.   A kidney obstruction present at birth (congenital obstruction).   Previous surgery on the kidney or ureters.   Numerous kidney infections.  SYMPTOMS   Feeling sick to your stomach  (nauseous).   Throwing up (vomiting).   Blood in the urine (hematuria).   Pain that usually spreads (radiates) to the groin.   Frequency or urgency of urination.  DIAGNOSIS   Taking a history and physical exam.   Blood or urine tests.   Computerized X-ray scan (CT scan).   Occasionally, an examination of the inside of the urinary bladder (cystoscopy) is performed.  TREATMENT   Observation.   Increasing your fluid intake.   Surgery may be needed if you have severe pain or persistent obstruction.  The size, location, and chemical composition are all important variables that will determine the proper choice of action for you. Talk to your caregiver to better understand your situation so that you will minimize the risk of injury to yourself and your kidney.  HOME CARE INSTRUCTIONS   Drink enough water and fluids to keep your urine clear or pale yellow.   Strain all urine through the provided strainer. Keep all particulate matter and stones for your caregiver to see. The stone causing the pain may be as small as a grain of salt. It is very important to use the strainer each and every time you pass your urine. The collection of your stone will allow your caregiver to analyze it and verify that a stone has actually passed.   Only take over-the-counter or prescription medicines for pain, discomfort, or fever as directed by your caregiver.   Make a follow-up appointment with your caregiver as directed.   Get follow-up X-rays if required. The absence of pain does not always mean that the stone has passed. It may have only stopped moving. If the urine remains completely obstructed, it can cause loss of kidney function or even complete destruction of the kidney. It is your responsibility to make sure X-rays and follow-ups are completed. Ultrasounds of the kidney can show blockages and the status of the kidney. Ultrasounds are not associated with any radiation and can be performed easily in a  matter of minutes.  SEEK IMMEDIATE MEDICAL CARE IF:   Pain cannot be controlled with the prescribed medicine.   You have a fever.   The severity or intensity of pain increases over 18 hours and is not relieved by pain medicine.   You develop a new onset of abdominal pain.   You feel faint or pass out.  MAKE SURE YOU:   Understand these instructions.   Will watch your condition.   Will get help right away if you are not doing well or get worse.  Document Released: 08/07/2005 Document Revised: 07/27/2011 Document Reviewed: 12/03/2009 [employer] Patient Information 2012 [employer]. 

## 2012-01-26 LAB — URINE CULTURE

## 2012-02-15 ENCOUNTER — Other Ambulatory Visit: Payer: Self-pay | Admitting: Obstetrics and Gynecology

## 2012-02-15 DIAGNOSIS — R2230 Localized swelling, mass and lump, unspecified upper limb: Secondary | ICD-10-CM

## 2012-02-19 ENCOUNTER — Other Ambulatory Visit: Payer: 59

## 2012-02-23 ENCOUNTER — Ambulatory Visit
Admission: RE | Admit: 2012-02-23 | Discharge: 2012-02-23 | Disposition: A | Discharge status: Home / Self Care | Payer: Medicaid Other | Source: Ambulatory Visit | Attending: Obstetrics and Gynecology | Admitting: Obstetrics and Gynecology

## 2012-02-23 DIAGNOSIS — R2230 Localized swelling, mass and lump, unspecified upper limb: Secondary | ICD-10-CM

## 2012-03-05 ENCOUNTER — Ambulatory Visit: Payer: Medicaid Other | Admitting: Physical Therapy

## 2012-03-14 ENCOUNTER — Ambulatory Visit: Payer: Medicaid Other | Attending: Obstetrics and Gynecology | Admitting: Physical Therapy

## 2012-03-14 DIAGNOSIS — M545 Low back pain, unspecified: Secondary | ICD-10-CM | POA: Insufficient documentation

## 2012-03-14 DIAGNOSIS — IMO0001 Reserved for inherently not codable concepts without codable children: Secondary | ICD-10-CM | POA: Insufficient documentation

## 2012-03-14 DIAGNOSIS — M2569 Stiffness of other specified joint, not elsewhere classified: Secondary | ICD-10-CM | POA: Insufficient documentation

## 2012-03-19 ENCOUNTER — Ambulatory Visit: Payer: Medicaid Other | Admitting: Physical Therapy

## 2012-03-20 ENCOUNTER — Encounter: Payer: Medicaid Other | Admitting: Physical Therapy

## 2012-03-26 ENCOUNTER — Inpatient Hospital Stay (HOSPITAL_COMMUNITY)
Admission: AD | Admit: 2012-03-26 | Discharge: 2012-03-26 | Disposition: A | Discharge status: Home / Self Care | Payer: Medicaid Other | Source: Ambulatory Visit | Attending: Obstetrics and Gynecology | Admitting: Obstetrics and Gynecology

## 2012-03-26 DIAGNOSIS — O26899 Other specified pregnancy related conditions, unspecified trimester: Secondary | ICD-10-CM

## 2012-03-26 DIAGNOSIS — R109 Unspecified abdominal pain: Secondary | ICD-10-CM | POA: Insufficient documentation

## 2012-03-26 DIAGNOSIS — N898 Other specified noninflammatory disorders of vagina: Secondary | ICD-10-CM

## 2012-03-26 DIAGNOSIS — O99891 Other specified diseases and conditions complicating pregnancy: Secondary | ICD-10-CM | POA: Insufficient documentation

## 2012-03-26 DIAGNOSIS — N949 Unspecified condition associated with female genital organs and menstrual cycle: Secondary | ICD-10-CM | POA: Insufficient documentation

## 2012-03-26 LAB — WET PREP, GENITAL: Yeast Wet Prep HPF POC: NONE SEEN

## 2012-03-26 LAB — URINALYSIS, ROUTINE W REFLEX MICROSCOPIC
Bilirubin Urine: NEGATIVE
Ketones, ur: NEGATIVE mg/dL
Leukocytes, UA: NEGATIVE
Nitrite: NEGATIVE
Protein, ur: NEGATIVE mg/dL
Urobilinogen, UA: 1 mg/dL (ref 0.0–1.0)
pH: 6.5 (ref 5.0–8.0)

## 2012-03-26 NOTE — MAU Note (Signed)
Pt presents with yeast infection and completed treatment on 03/23/12.  Pt states she feels like she is still leaking fluid occassionally but was unsure if it was from yeast infection or her water being broken.  +FM. Pt with headache and dizziness that decreases when laying in a dark room.  Pt was to have an appointment today at 1415 but was then told by the office to come to MAU for leaking.  Pt with lower abdominal pain of 4 on 0-10 scale more so on left than right. Pt with UTI and kindey infection about 2-3 months ago.

## 2012-03-26 NOTE — MAU Note (Signed)
Pt states, " Since yesterday at noon I've had little spurts of water; it isn't much but the office wanted to to come here to get checked. Also I've had sharp pain in my lower abdomen. It is on both sides but worse on the left."

## 2012-03-26 NOTE — MAU Provider Note (Signed)
History     CSN: [id number]  Arrival date and time: 03/26/12 1221   First Provider Initiated Contact with Patient 03/26/12 1417      Chief Complaint  Patient presents with  . Abdominal Pain  . Rupture of Membranes   HPI Tracey Chen is a 26 y.o. female @ [redacted]w[redacted]d gestation  who presents to MAU for ? ROM. Patient states that she noted leaking of fluid yesterday. Associated symptoms include lower abdominal cramping. Today continues to feels like fluid from vagina but states she has a yeast infection that is causing vaginal discharge as well. Last sexual intercourse 2 week ago. The history was provided by the patient.  OB History    Grav Para Term Preterm Abortions TAB SAB Ect Mult Living   2 1 1       1       Past Medical History  Diagnosis Date  . Anemia   . Anxiety     treated with lexapro at age 54  . Pregnancy induced hypertension 2012    Past Surgical History  Procedure Date  . Cesarean section     Family History  Problem Relation Age of Onset  . Thyroid disease Mother   . Mental illness Mother     PTSD from husband's death  . Asthma Neg Hx   . Cancer Neg Hx   . Heart disease Neg Hx   . Stroke Neg Hx     History  Substance Use Topics  . Smoking status: Current Everyday Smoker -- 0.5 packs/day    Types: Cigarettes  . Smokeless tobacco: Not on file  . Alcohol Use: No    Allergies:  Allergies  Allergen Reactions  . Latex Rash    Prescriptions prior to admission  Medication Sig Dispense Refill  . acetaminophen (TYLENOL) 325 MG tablet Take 650 mg by mouth every 6 (six) hours as needed. pain      . Prenatal Vit-Fe Fumarate-FA (PRENATAL MULTIVITAMIN) TABS Take 1 tablet by mouth every morning.      . pyridOXINE (VITAMIN B-6) 100 MG tablet Take 100 mg by mouth daily.        Review of Systems  Constitutional: Negative for fever, chills and weight loss.  HENT: Negative for ear pain, nosebleeds, congestion, sore throat and neck pain.   Eyes: Positive for  blurred vision. Negative for double vision, photophobia and pain.  Respiratory: Negative for cough, shortness of breath and wheezing.   Cardiovascular: Negative for chest pain, palpitations and leg swelling.  Gastrointestinal: Positive for abdominal pain (cramping). Negative for heartburn, nausea, vomiting, diarrhea and constipation.  Genitourinary: Negative for dysuria, urgency and frequency.  Musculoskeletal: Positive for back pain. Negative for myalgias.  Skin: Negative for itching and rash.  Neurological: Positive for dizziness and headaches. Negative for sensory change, speech change, seizures and weakness.  Endo/Heme/Allergies: Does not bruise/bleed easily.  Psychiatric/Behavioral: Negative for depression. The patient is nervous/anxious (anxiety attack yesterday). The patient does not have insomnia.    Physical Exam   Blood pressure 125/74, pulse 99, temperature 99 F (37.2 C), temperature source Oral, resp. rate 18, height 5\' 5"  (1.651 m), weight 183 lb 4 oz (83.122 kg), last menstrual period 08/27/2011.  Physical Exam  Nursing note and vitals reviewed. Constitutional: She is oriented to person, place, and time. She appears well-developed and well-nourished. No distress.  HENT:  Head: Normocephalic and atraumatic.  Eyes: EOM are normal.  Neck: Neck supple.  Cardiovascular: Normal rate.   Respiratory: Effort normal.  GI: Soft. There is no tenderness.       Gravid consistent with dates.  Genitourinary:       External genitalia without lesions. Thick white discharge vaginal vault. No pooling or leaking of fluid noted. Cervix closed, thick, high. Uterus consistent with dates.  Musculoskeletal: Normal range of motion.  Neurological: She is alert and oriented to person, place, and time.  Skin: Skin is warm and dry.  Psychiatric: She has a normal mood and affect. Her behavior is normal. Judgment and thought content normal.   Results for orders placed during the hospital encounter of  03/26/12 (from the past 24 hour(s))  URINALYSIS, ROUTINE W REFLEX MICROSCOPIC     Status: Abnormal   Collection Time   03/26/12 12:44 PM      Component Value Range   Color, Urine YELLOW  YELLOW   APPearance CLOUDY (*) CLEAR   Specific Gravity, Urine 1.025  1.005 - 1.030   pH 6.5  5.0 - 8.0   Glucose, UA NEGATIVE  NEGATIVE mg/dL   Hgb urine dipstick NEGATIVE  NEGATIVE   Bilirubin Urine NEGATIVE  NEGATIVE   Ketones, ur NEGATIVE  NEGATIVE mg/dL   Protein, ur NEGATIVE  NEGATIVE mg/dL   Urobilinogen, UA 1.0  0.0 - 1.0 mg/dL   Nitrite NEGATIVE  NEGATIVE   Leukocytes, UA NEGATIVE  NEGATIVE  WET PREP, GENITAL     Status: Abnormal   Collection Time   03/26/12  2:55 PM      Component Value Range   Yeast Wet Prep HPF POC NONE SEEN  NONE SEEN   Trich, Wet Prep NONE SEEN  NONE SEEN   Clue Cells Wet Prep HPF POC NONE SEEN  NONE SEEN   WBC, Wet Prep HPF POC FEW (*) NONE SEEN   Fern negative  EFM: Baseline 155, uterine irritability, reassuring for gestation  MAU Course: @ 14:26 discussed with Dr. Dareen Piano  Procedures Assessment: 26 y.o. female @ [redacted]w[redacted]d gestation with vaginal discharge  Plan:  Cultures for GC and Chlamydia sent   Follow up in the office, return here as needed. Follow-up Information    Schedule an appointment as soon as possible for a visit with Levi Aland, MD.   Contact information:   [address] [place] [address] [phone]         Medication List  As of 03/26/2012  3:32 PM   CONTINUE taking these medications         acetaminophen 325 MG tablet   Commonly known as: TYLENOL      prenatal multivitamin Tabs      pyridOXINE 100 MG tablet   Commonly known as: VITAMIN B-6            NEESE,HOPE,RN FNP, BC 03/26/2012, 3:25 PM

## 2012-03-28 ENCOUNTER — Ambulatory Visit: Payer: Medicaid Other | Admitting: Physical Therapy

## 2012-04-04 ENCOUNTER — Encounter: Payer: Medicaid Other | Admitting: Physical Therapy

## 2012-04-11 ENCOUNTER — Encounter: Payer: Medicaid Other | Admitting: Physical Therapy

## 2012-05-05 ENCOUNTER — Encounter (HOSPITAL_COMMUNITY): Payer: Self-pay | Admitting: *Deleted

## 2012-05-05 ENCOUNTER — Inpatient Hospital Stay (HOSPITAL_COMMUNITY)
Admission: AD | Admit: 2012-05-05 | Discharge: 2012-05-05 | Disposition: A | Discharge status: Hospice | Payer: Medicaid Other | Source: Ambulatory Visit | Attending: Obstetrics and Gynecology | Admitting: Obstetrics and Gynecology

## 2012-05-05 DIAGNOSIS — B359 Dermatophytosis, unspecified: Secondary | ICD-10-CM | POA: Insufficient documentation

## 2012-05-05 DIAGNOSIS — O99891 Other specified diseases and conditions complicating pregnancy: Secondary | ICD-10-CM | POA: Insufficient documentation

## 2012-05-05 DIAGNOSIS — B354 Tinea corporis: Secondary | ICD-10-CM

## 2012-05-05 HISTORY — DX: Dermatophytosis, unspecified: B35.9

## 2012-05-05 HISTORY — DX: Dermatitis, unspecified: L30.9

## 2012-05-05 MED ORDER — SULCONAZOLE NITRATE 1 % EX CREA: TOPICAL_CREAM | Freq: Every day | CUTANEOUS | Status: DC

## 2012-05-05 MED ORDER — HYDROXYZINE HCL 25 MG PO TABS: 25.0000 mg | ORAL_TABLET | Freq: Four times a day (QID) | ORAL | Status: AC

## 2012-05-05 NOTE — MAU Provider Note (Signed)
History     CSN: [id number]  Arrival date and time: 05/05/12 1330   First Provider Initiated Contact with Patient 05/05/12 1519      Chief Complaint  Patient presents with  . Rash   HPI Tracey Chen 26 y.o. [redacted]w[redacted]d  Comes to MAU with itchy rash on left arm.  Denies any other areas that are itching.  Tried Benadryl by mouth and Bendryl cream on her arm and it did not help the itching.  OB History    Grav Para Term Preterm Abortions TAB SAB Ect Mult Living   2 1 1       1       Past Medical History  Diagnosis Date  . Anemia   . Anxiety     treated with lexapro at age 77  . Pregnancy induced hypertension 2012  . Ringworm   . Eczema     Past Surgical History  Procedure Date  . Cesarean section   . Wisdom tooth extraction     Family History  Problem Relation Age of Onset  . Thyroid disease Mother   . Mental illness Mother     PTSD from husband's death  . Asthma Neg Hx   . Cancer Neg Hx   . Heart disease Neg Hx   . Stroke Neg Hx     History  Substance Use Topics  . Smoking status: Former Smoker -- 0.5 packs/day    Types: Cigarettes    Quit date: 11/03/2011  . Smokeless tobacco: Not on file  . Alcohol Use: No    Allergies:  Allergies  Allergen Reactions  . Latex Rash    Prescriptions prior to admission  Medication Sig Dispense Refill  . acetaminophen (TYLENOL) 325 MG tablet Take 650 mg by mouth every 6 (six) hours as needed. pain      . Prenatal Vit-Fe Fumarate-FA (PRENATAL MULTIVITAMIN) TABS Take 1 tablet by mouth every morning.      . pyridOXINE (VITAMIN B-6) 100 MG tablet Take 100 mg by mouth daily.        Review of Systems  Constitutional: Negative for fever.  Gastrointestinal: Negative for abdominal pain.  Genitourinary:       No vaginal discharge. No vaginal bleeding. No dysuria.  Skin: Positive for itching and rash.   Physical Exam   Blood pressure 121/62, pulse 94, temperature 98.2 F (36.8 C), temperature source Oral, resp. rate 18,  height 5\' 5"  (1.651 m), weight 84.823 kg (187 lb), last menstrual period 08/27/2011, unknown if currently breastfeeding.  Physical Exam  Nursing note and vitals reviewed. Constitutional: She is oriented to person, place, and time. She appears well-developed and well-nourished.  HENT:  Head: Normocephalic.  Eyes: EOM are normal.  Neck: Neck supple.  Musculoskeletal: Normal range of motion.  Neurological: She is alert and oriented to person, place, and time.  Skin: Skin is warm and dry.       5-6 circular patches of rash noted.  Mildly red and raised.  No weeping.  No ulceration.  No blisters.  One larger area with some central clearing noted.  Suspicious for ringworm.   Psychiatric: She has a normal mood and affect.    MAU Course  Procedures  MDM Consult with Dr. Dareen Piano   Assessment and Plan  Ringworm Pregnancy [redacted]w[redacted]d  Plan rx Exelderm 1% cream Apply daily to area til resolved.  30 gm tube one refill rx hydroxizine 25 mg PO q 6 h PRN for itching (#30) no refills Can  take over the counter Claritin if needed to decrease itching.  Tracey Chen 05/05/2012, 3:52 PM

## 2012-05-05 NOTE — MAU Note (Signed)
Pt noticed four circular rashes on left foream yesterday.  Put benedryl cream on area, which she said burned, she then took a benedryl pill, neither of which helped with the itching although she did fall asleep.

## 2012-05-05 NOTE — MAU Note (Signed)
Pt reports  Having a rash that just appeared on her left  arm. It is itchy . Pt took benadryl last night without much releif

## 2012-05-07 ENCOUNTER — Other Ambulatory Visit: Payer: Self-pay | Admitting: Obstetrics and Gynecology

## 2012-05-25 ENCOUNTER — Inpatient Hospital Stay (HOSPITAL_COMMUNITY)
Admission: AD | Admit: 2012-05-25 | Discharge: 2012-05-26 | Disposition: A | Discharge status: Home / Self Care | Payer: Medicaid Other | Source: Ambulatory Visit | Attending: Obstetrics and Gynecology | Admitting: Obstetrics and Gynecology

## 2012-05-25 ENCOUNTER — Encounter (HOSPITAL_COMMUNITY): Payer: Self-pay | Admitting: *Deleted

## 2012-05-25 DIAGNOSIS — M545 Low back pain, unspecified: Secondary | ICD-10-CM | POA: Insufficient documentation

## 2012-05-25 DIAGNOSIS — O34219 Maternal care for unspecified type scar from previous cesarean delivery: Secondary | ICD-10-CM | POA: Diagnosis present

## 2012-05-25 DIAGNOSIS — O479 False labor, unspecified: Secondary | ICD-10-CM

## 2012-05-25 DIAGNOSIS — O4700 False labor before 37 completed weeks of gestation, unspecified trimester: Secondary | ICD-10-CM | POA: Insufficient documentation

## 2012-05-25 DIAGNOSIS — R109 Unspecified abdominal pain: Secondary | ICD-10-CM | POA: Insufficient documentation

## 2012-05-25 LAB — URINALYSIS, ROUTINE W REFLEX MICROSCOPIC
Glucose, UA: NEGATIVE mg/dL
Ketones, ur: 15 mg/dL — AB
Leukocytes, UA: NEGATIVE
Nitrite: NEGATIVE
Specific Gravity, Urine: 1.02 (ref 1.005–1.030)
pH: 6.5 (ref 5.0–8.0)

## 2012-05-25 NOTE — MAU Note (Signed)
Patient describes right flank/back pain that started today. She feels like it is a kidney infection. Temp at 100.1, took tylenol around 8:45 pm.

## 2012-05-25 NOTE — MAU Note (Signed)
Have pain in lower R back and R abdomen since yest. Worse today. Sharp pain in back and crampy in abd. Hx kidney infection which was ? On right during pregnancy.

## 2012-05-26 ENCOUNTER — Inpatient Hospital Stay (EMERGENCY_DEPARTMENT_HOSPITAL)
Admission: AD | Admit: 2012-05-26 | Discharge: 2012-05-26 | Disposition: A | Discharge status: Home / Self Care | Payer: Medicaid Other | Source: Ambulatory Visit | Attending: Obstetrics and Gynecology | Admitting: Obstetrics and Gynecology

## 2012-05-26 ENCOUNTER — Encounter (HOSPITAL_COMMUNITY): Payer: Self-pay | Admitting: Obstetrics and Gynecology

## 2012-05-26 DIAGNOSIS — O34219 Maternal care for unspecified type scar from previous cesarean delivery: Secondary | ICD-10-CM | POA: Diagnosis present

## 2012-05-26 DIAGNOSIS — O4700 False labor before 37 completed weeks of gestation, unspecified trimester: Secondary | ICD-10-CM | POA: Insufficient documentation

## 2012-05-26 DIAGNOSIS — O479 False labor, unspecified: Secondary | ICD-10-CM

## 2012-05-26 DIAGNOSIS — M545 Low back pain, unspecified: Secondary | ICD-10-CM | POA: Insufficient documentation

## 2012-05-26 DIAGNOSIS — O3421 Maternal care for scar from previous cesarean delivery: Secondary | ICD-10-CM

## 2012-05-26 DIAGNOSIS — O99891 Other specified diseases and conditions complicating pregnancy: Secondary | ICD-10-CM | POA: Insufficient documentation

## 2012-05-26 DIAGNOSIS — R109 Unspecified abdominal pain: Secondary | ICD-10-CM | POA: Insufficient documentation

## 2012-05-26 LAB — URINALYSIS, ROUTINE W REFLEX MICROSCOPIC
Nitrite: NEGATIVE
Specific Gravity, Urine: 1.02 (ref 1.005–1.030)
Urobilinogen, UA: 0.2 mg/dL (ref 0.0–1.0)
pH: 7 (ref 5.0–8.0)

## 2012-05-26 LAB — CBC WITH DIFFERENTIAL/PLATELET
Basophils Absolute: 0 10*3/uL (ref 0.0–0.1)
Basophils Relative: 0 % (ref 0–1)
HCT: 26.7 % — ABNORMAL LOW (ref 36.0–46.0)
Hemoglobin: 8.9 g/dL — ABNORMAL LOW (ref 12.0–15.0)
Lymphocytes Relative: 26 % (ref 12–46)
MCHC: 33.3 g/dL (ref 30.0–36.0)
Monocytes Absolute: 0.9 10*3/uL (ref 0.1–1.0)
Monocytes Relative: 7 % (ref 3–12)
Neutro Abs: 8.8 10*3/uL — ABNORMAL HIGH (ref 1.7–7.7)
Neutrophils Relative %: 66 % (ref 43–77)
RDW: 14.3 % (ref 11.5–15.5)
WBC: 13.3 10*3/uL — ABNORMAL HIGH (ref 4.0–10.5)

## 2012-05-26 MED ORDER — OXYCODONE-ACETAMINOPHEN 5-325 MG PO TABS
1.0000 | ORAL_TABLET | Freq: Once | ORAL | Status: DC
  Filled 2012-05-26 (×2): qty 1

## 2012-05-26 MED ORDER — TERBUTALINE SULFATE 1 MG/ML IJ SOLN
0.2500 mg | Freq: Once | INTRAMUSCULAR | Status: AC
  Administered 2012-05-26: 0.25 mg via SUBCUTANEOUS
  Filled 2012-05-26: qty 1

## 2012-05-26 MED ORDER — OXYCODONE-ACETAMINOPHEN 5-325 MG PO TABS
1.0000 | ORAL_TABLET | Freq: Once | ORAL | Status: AC
  Administered 2012-05-26: 1 via ORAL

## 2012-05-26 MED ORDER — NIFEDIPINE 10 MG PO CAPS
10.0000 mg | ORAL_CAPSULE | Freq: Once | ORAL | Status: AC
  Administered 2012-05-26: 10 mg via ORAL
  Filled 2012-05-26: qty 1

## 2012-05-26 NOTE — MAU Provider Note (Signed)
History     CSN: [id number]  Arrival date and time: 05/25/12 2254   None     Chief Complaint  Patient presents with  . Back Pain  . Abdominal Pain   HPI This is a 26 y.o. female at [redacted]w[redacted]d who presents with c/o low abdominal and low back pain today. Does seem to come and go like contractions. Back pain coincides with contractions. States had fever of 100.1 at home. Denies other symptoms like N/V/C/D.  OB History    Grav Para Term Preterm Abortions TAB SAB Ect Mult Living   2 1 1       1       Past Medical History  Diagnosis Date  . Anemia   . Anxiety     treated with lexapro at age 31  . Pregnancy induced hypertension 2012  . Ringworm   . Eczema     Past Surgical History  Procedure Date  . Cesarean section   . Wisdom tooth extraction     Family History  Problem Relation Age of Onset  . Thyroid disease Mother   . Mental illness Mother     PTSD from husband's death  . Asthma Neg Hx   . Cancer Neg Hx   . Heart disease Neg Hx   . Stroke Neg Hx     History  Substance Use Topics  . Smoking status: Former Smoker -- 0.5 packs/day    Types: Cigarettes    Quit date: 11/03/2011  . Smokeless tobacco: Not on file  . Alcohol Use: No    Allergies:  Allergies  Allergen Reactions  . Latex Rash    Prescriptions prior to admission  Medication Sig Dispense Refill  . acetaminophen (TYLENOL) 325 MG tablet Take 650 mg by mouth every 6 (six) hours as needed. pain      . Prenatal Vit-Fe Fumarate-FA (PRENATAL MULTIVITAMIN) TABS Take 1 tablet by mouth every morning.      . pyridOXINE (VITAMIN B-6) 100 MG tablet Take 100 mg by mouth daily.      . sulconazole (EXELDERM) 1 % cream Apply topically daily.  30 g  0    ROS See HPI  Physical Exam   Blood pressure 117/62, pulse 99, temperature 98.3 F (36.8 C), temperature source Oral, resp. rate 20, height 5\' 5"  (1.651 m), weight 189 lb 6.4 oz (85.911 kg), last menstrual period 08/27/2011, unknown if currently  breastfeeding.  Physical Exam  Constitutional: She is oriented to person, place, and time. She appears well-developed and well-nourished. No distress.  Cardiovascular: Normal rate.   Respiratory: Effort normal.  GI: Soft. She exhibits no distension. There is no tenderness. There is no rebound and no guarding.  Genitourinary: Vagina normal and uterus normal. No vaginal discharge found.       Dilation: 2 Effacement (%): 80 Cervical Position: Anterior Station: -3 Presentation: Vertex Exam by:: Artelia Laroche CNM   Musculoskeletal: Normal range of motion.  Neurological: She is alert and oriented to person, place, and time.  Skin: Skin is warm and dry.  Psychiatric: She has a normal mood and affect.  FHR reactive UCs every 3 minutes, mild   MAU Course  Procedures  MDM Discussed with Dr Henderson Cloud. CBC ordered and Terbutaline also ordered. >> UCs lessened after Terbuataline. She did still have a few contractions, but much less frequent.  Still has some back pain.  Cervix rechecked and unchanged.   Assessment and Plan  A:  SIUP at [redacted]w[redacted]d  Preterm contractions      Previous C/S, plans repeat  P:  Discharge home (Dr Henderson Cloud updated)       One Percocet given to counteract Terb effects and help back pain       Strict labor precautions reviewed in light of previous c/s       Followup with office if contractions do not recur this weekend   [facility] 05/26/2012, 12:04 AM

## 2012-05-26 NOTE — MAU Note (Signed)
Pt reports having back pain and contractions. Was seen in MAU last night for same complaint and was given a "shot"(terbutaline) and some percocet. Contractions went away for a while but now they are back.

## 2012-05-26 NOTE — MAU Note (Signed)
Pt presents to MAU with chief complaint of contractions. Pt was seen in MAU last night and was given medication to stop contractions. Pt says the contractions stopped and then came back today around 1600. Pt says her cervix was dilated 2 cm.

## 2012-05-26 NOTE — MAU Provider Note (Signed)
History     CSN: [id number]  Arrival date and time: 05/26/12 4098   First Provider Initiated Contact with Patient 05/26/12 1858      Chief Complaint  Patient presents with  . Contractions   HPI 26 y.o. G2P1001 at [redacted]w[redacted]d with low abd pain and low back pain since last night, abd pain feels like "mild cramping", low back pain is "sharp" comes about q7-10 minutes for about 1 min. No bleeding or LOF. Diagnosed with yeast infection this week but has not gotten rx filled. + fetal movement. Evaluated in MAU last night, 2/80/-3, was given terbutaline, states she felt better afterwards throughout most of today, but pain has increased tonight.    Past Medical History  Diagnosis Date  . Anemia   . Anxiety     treated with lexapro at age 89  . Pregnancy induced hypertension 2012  . Ringworm   . Eczema     Past Surgical History  Procedure Date  . Cesarean section   . Wisdom tooth extraction     Family History  Problem Relation Age of Onset  . Thyroid disease Mother   . Mental illness Mother     PTSD from husband's death  . Asthma Neg Hx   . Cancer Neg Hx   . Heart disease Neg Hx   . Stroke Neg Hx     History  Substance Use Topics  . Smoking status: Former Smoker -- 0.5 packs/day    Types: Cigarettes    Quit date: 11/03/2011  . Smokeless tobacco: Not on file  . Alcohol Use: No    Allergies:  Allergies  Allergen Reactions  . Latex Rash    Prescriptions prior to admission  Medication Sig Dispense Refill  . acetaminophen (TYLENOL) 325 MG tablet Take 650 mg by mouth every 6 (six) hours as needed. pain      . famotidine (PEPCID AC) 10 MG chewable tablet Chew 10 mg by mouth at bedtime as needed. Stomach acid      . Prenatal Vit-Fe Fumarate-FA (PRENATAL MULTIVITAMIN) TABS Take 1 tablet by mouth every morning.      . pyridOXINE (VITAMIN B-6) 100 MG tablet Take 100 mg by mouth daily.      . sulconazole (EXELDERM) 1 % cream Apply topically daily.  30 g  0    Review of  Systems  Constitutional: Negative.   Respiratory: Negative.   Cardiovascular: Negative.   Gastrointestinal: Negative for nausea, vomiting, abdominal pain, diarrhea and constipation.  Genitourinary: Negative for dysuria, urgency, frequency, hematuria and flank pain.       Negative for vaginal bleeding, Positive for cramping/contractions  Musculoskeletal: Positive for back pain.  Neurological: Negative.   Psychiatric/Behavioral: Negative.    Physical Exam   Blood pressure 132/80, pulse 98, temperature 98.7 F (37.1 C), temperature source Oral, resp. rate 18, height 5\' 5"  (1.651 m), weight 186 lb 12.8 oz (84.732 kg), last menstrual period 08/27/2011.  Physical Exam  Nursing note and vitals reviewed. Constitutional: She is oriented to person, place, and time. She appears well-developed and well-nourished. No distress.  Cardiovascular: Normal rate.   Respiratory: Effort normal.  GI: Soft. There is no tenderness.  Genitourinary:       SVE: 3/20/ballotable   Musculoskeletal: Normal range of motion.  Neurological: She is alert and oriented to person, place, and time.  Skin: Skin is warm and dry.  Psychiatric: She has a normal mood and affect.   EFM: 140, mod variability, + accels TOCO: irritability  MAU Course  Procedures Results for orders placed during the hospital encounter of 05/26/12 (from the past 48 hour(s))  URINALYSIS, ROUTINE W REFLEX MICROSCOPIC     Status: Normal   Collection Time   05/26/12  6:30 PM      Component Value Range Comment   Color, Urine YELLOW  YELLOW    APPearance CLEAR  CLEAR    Specific Gravity, Urine 1.020  1.005 - 1.030    pH 7.0  5.0 - 8.0    Glucose, UA NEGATIVE  NEGATIVE mg/dL    Hgb urine dipstick NEGATIVE  NEGATIVE    Bilirubin Urine NEGATIVE  NEGATIVE    Ketones, ur NEGATIVE  NEGATIVE mg/dL    Protein, ur NEGATIVE  NEGATIVE mg/dL    Urobilinogen, UA 0.2  0.0 - 1.0 mg/dL    Nitrite NEGATIVE  NEGATIVE    Leukocytes, UA NEGATIVE  NEGATIVE  MICROSCOPIC NOT DONE ON URINES WITH NEGATIVE PROTEIN, BLOOD, LEUKOCYTES, NITRITE, OR GLUCOSE <1000 mg/dL.      Marland Kitchen NIFEdipine  10 mg Oral Once     Assessment and Plan  Care assumed by [facility], NP  [facility] 05/26/2012, 7:11 PM   Patient feeling much better. No contractions. Patient request that she be discharged home. Will d/c home. She will follow up in the office this week.

## 2012-05-28 LAB — OB RESULTS CONSOLE GBS: GBS: NEGATIVE

## 2012-06-13 ENCOUNTER — Encounter (HOSPITAL_COMMUNITY): Payer: Self-pay | Admitting: Pharmacist

## 2012-06-16 ENCOUNTER — Encounter (HOSPITAL_COMMUNITY): Payer: Self-pay | Admitting: *Deleted

## 2012-06-16 ENCOUNTER — Inpatient Hospital Stay (HOSPITAL_COMMUNITY)
Admission: AD | Admit: 2012-06-16 | Discharge: 2012-06-16 | Disposition: A | Discharge status: Home / Self Care | Payer: Medicaid Other | Source: Ambulatory Visit | Attending: Obstetrics and Gynecology | Admitting: Obstetrics and Gynecology

## 2012-06-16 DIAGNOSIS — N949 Unspecified condition associated with female genital organs and menstrual cycle: Secondary | ICD-10-CM | POA: Insufficient documentation

## 2012-06-16 DIAGNOSIS — M549 Dorsalgia, unspecified: Secondary | ICD-10-CM | POA: Insufficient documentation

## 2012-06-16 DIAGNOSIS — O99891 Other specified diseases and conditions complicating pregnancy: Secondary | ICD-10-CM | POA: Insufficient documentation

## 2012-06-16 LAB — AMNISURE RUPTURE OF MEMBRANE (ROM) NOT AT ARMC: Amnisure ROM: NEGATIVE

## 2012-06-16 NOTE — Progress Notes (Signed)
Negative Amiosure results given as well as neg fern--orders to d/c home

## 2012-06-17 ENCOUNTER — Inpatient Hospital Stay (HOSPITAL_COMMUNITY)
Admission: AD | Admit: 2012-06-17 | Discharge: 2012-06-20 | DRG: 766 | Disposition: A | Discharge status: Home / Self Care | Payer: 59 | Source: Ambulatory Visit | Attending: Obstetrics and Gynecology | Admitting: Obstetrics and Gynecology

## 2012-06-17 ENCOUNTER — Encounter (HOSPITAL_COMMUNITY): Payer: Self-pay

## 2012-06-17 DIAGNOSIS — O9903 Anemia complicating the puerperium: Secondary | ICD-10-CM | POA: Diagnosis not present

## 2012-06-17 DIAGNOSIS — D649 Anemia, unspecified: Secondary | ICD-10-CM | POA: Diagnosis not present

## 2012-06-17 DIAGNOSIS — O34219 Maternal care for unspecified type scar from previous cesarean delivery: Secondary | ICD-10-CM

## 2012-06-17 LAB — URINALYSIS, ROUTINE W REFLEX MICROSCOPIC
Hgb urine dipstick: NEGATIVE
Nitrite: NEGATIVE
Protein, ur: NEGATIVE mg/dL
Specific Gravity, Urine: 1.02 (ref 1.005–1.030)
Urobilinogen, UA: 0.2 mg/dL (ref 0.0–1.0)

## 2012-06-17 MED ORDER — NIFEDIPINE 10 MG PO CAPS
10.0000 mg | ORAL_CAPSULE | Freq: Once | ORAL | Status: AC
  Administered 2012-06-17: 10 mg via ORAL
  Filled 2012-06-17: qty 1

## 2012-06-17 NOTE — MAU Note (Signed)
Pt reports contractions and pressure, scheduled c/s

## 2012-06-18 ENCOUNTER — Encounter (HOSPITAL_COMMUNITY): Payer: Self-pay | Admitting: Anesthesiology

## 2012-06-18 ENCOUNTER — Inpatient Hospital Stay (HOSPITAL_COMMUNITY): Payer: 59 | Admitting: Anesthesiology

## 2012-06-18 ENCOUNTER — Encounter (HOSPITAL_COMMUNITY): Payer: Self-pay | Admitting: *Deleted

## 2012-06-18 ENCOUNTER — Encounter (HOSPITAL_COMMUNITY)
Admission: AD | Disposition: A | Discharge status: Home / Self Care | Payer: Self-pay | Source: Ambulatory Visit | Attending: Obstetrics and Gynecology

## 2012-06-18 LAB — CBC
Hemoglobin: 9.9 g/dL — ABNORMAL LOW (ref 12.0–15.0)
MCH: 30.7 pg (ref 26.0–34.0)
MCHC: 33.4 g/dL (ref 30.0–36.0)
MCV: 91.9 fL (ref 78.0–100.0)
Platelets: 318 10*3/uL (ref 150–400)
RBC: 3.22 MIL/uL — ABNORMAL LOW (ref 3.87–5.11)

## 2012-06-18 LAB — ABO/RH: ABO/RH(D): A POS

## 2012-06-18 SURGERY — Surgical Case
Anesthesia: Spinal | Site: Abdomen | Wound class: Clean Contaminated

## 2012-06-18 MED ORDER — 0.9 % SODIUM CHLORIDE (POUR BTL) OPTIME
TOPICAL | Status: DC | PRN
  Administered 2012-06-18: 600 mL
  Administered 2012-06-18: 200 mL

## 2012-06-18 MED ORDER — SCOPOLAMINE 1 MG/3DAYS TD PT72
MEDICATED_PATCH | TRANSDERMAL | Status: AC
  Filled 2012-06-18: qty 1

## 2012-06-18 MED ORDER — MORPHINE SULFATE (PF) 0.5 MG/ML IJ SOLN
INTRAMUSCULAR | Status: DC | PRN
  Administered 2012-06-18: .2 mg via INTRATHECAL

## 2012-06-18 MED ORDER — SENNOSIDES-DOCUSATE SODIUM 8.6-50 MG PO TABS
2.0000 | ORAL_TABLET | Freq: Every day | ORAL | Status: DC
  Administered 2012-06-18 – 2012-06-19 (×2): 2 via ORAL

## 2012-06-18 MED ORDER — HYDROMORPHONE HCL PF 1 MG/ML IJ SOLN: 0.2500 mg | INTRAMUSCULAR | Status: DC | PRN

## 2012-06-18 MED ORDER — SODIUM CHLORIDE 0.9 % IV SOLN
1.0000 ug/kg/h | INTRAVENOUS | Status: DC | PRN
  Filled 2012-06-18: qty 2.5

## 2012-06-18 MED ORDER — FENTANYL CITRATE 0.05 MG/ML IJ SOLN
INTRAMUSCULAR | Status: DC | PRN
  Administered 2012-06-18: 25 ug via INTRATHECAL

## 2012-06-18 MED ORDER — DIBUCAINE 1 % RE OINT: 1.0000 "application " | TOPICAL_OINTMENT | RECTAL | Status: DC | PRN

## 2012-06-18 MED ORDER — PNEUMOCOCCAL VAC POLYVALENT 25 MCG/0.5ML IJ INJ
0.5000 mL | INJECTION | INTRAMUSCULAR | Status: AC
  Administered 2012-06-19: 0.5 mL via INTRAMUSCULAR
  Filled 2012-06-18: qty 0.5

## 2012-06-18 MED ORDER — ONDANSETRON HCL 4 MG/2ML IJ SOLN
INTRAMUSCULAR | Status: AC
  Filled 2012-06-18: qty 2

## 2012-06-18 MED ORDER — BUPIVACAINE IN DEXTROSE 0.75-8.25 % IT SOLN
INTRATHECAL | Status: DC | PRN
  Administered 2012-06-18: 1.4 mL via INTRATHECAL

## 2012-06-18 MED ORDER — METOCLOPRAMIDE HCL 5 MG/ML IJ SOLN: 10.0000 mg | Freq: Three times a day (TID) | INTRAMUSCULAR | Status: DC | PRN

## 2012-06-18 MED ORDER — ONDANSETRON HCL 4 MG PO TABS: 4.0000 mg | ORAL_TABLET | ORAL | Status: DC | PRN

## 2012-06-18 MED ORDER — DIPHENHYDRAMINE HCL 50 MG/ML IJ SOLN: 12.5000 mg | INTRAMUSCULAR | Status: DC | PRN

## 2012-06-18 MED ORDER — MENTHOL 3 MG MT LOZG: 1.0000 | LOZENGE | OROMUCOSAL | Status: DC | PRN

## 2012-06-18 MED ORDER — CITRIC ACID-SODIUM CITRATE 334-500 MG/5ML PO SOLN
30.0000 mL | Freq: Once | ORAL | Status: AC
  Administered 2012-06-18: 30 mL via ORAL
  Filled 2012-06-18: qty 15

## 2012-06-18 MED ORDER — KETOROLAC TROMETHAMINE 30 MG/ML IJ SOLN: 30.0000 mg | Freq: Four times a day (QID) | INTRAMUSCULAR | Status: AC | PRN

## 2012-06-18 MED ORDER — PRENATAL MULTIVITAMIN CH
1.0000 | ORAL_TABLET | Freq: Every day | ORAL | Status: DC
  Administered 2012-06-19 (×2): 1 via ORAL
  Filled 2012-06-18 (×2): qty 1

## 2012-06-18 MED ORDER — OXYTOCIN 40 UNITS IN LACTATED RINGERS INFUSION - SIMPLE MED: 62.5000 mL/h | INTRAVENOUS | Status: AC

## 2012-06-18 MED ORDER — ONDANSETRON HCL 4 MG/2ML IJ SOLN: 4.0000 mg | INTRAMUSCULAR | Status: DC | PRN

## 2012-06-18 MED ORDER — NALBUPHINE SYRINGE 5 MG/0.5 ML
5.0000 mg | INJECTION | INTRAMUSCULAR | Status: DC | PRN
  Filled 2012-06-18: qty 1

## 2012-06-18 MED ORDER — OXYTOCIN 10 UNIT/ML IJ SOLN
40.0000 [IU] | INTRAVENOUS | Status: DC | PRN
  Administered 2012-06-18: 03:00:00 via INTRAVENOUS
  Administered 2012-06-18: 40 [IU] via INTRAVENOUS

## 2012-06-18 MED ORDER — FAMOTIDINE IN NACL 20-0.9 MG/50ML-% IV SOLN
20.0000 mg | Freq: Once | INTRAVENOUS | Status: AC
  Administered 2012-06-18: 20 mg via INTRAVENOUS
  Filled 2012-06-18: qty 50

## 2012-06-18 MED ORDER — EPHEDRINE SULFATE 50 MG/ML IJ SOLN
INTRAMUSCULAR | Status: DC | PRN
  Administered 2012-06-18 (×2): 10 mg via INTRAVENOUS

## 2012-06-18 MED ORDER — ONDANSETRON HCL 4 MG/2ML IJ SOLN: 4.0000 mg | Freq: Three times a day (TID) | INTRAMUSCULAR | Status: DC | PRN

## 2012-06-18 MED ORDER — IBUPROFEN 600 MG PO TABS
600.0000 mg | ORAL_TABLET | Freq: Four times a day (QID) | ORAL | Status: DC
  Administered 2012-06-18 – 2012-06-20 (×8): 600 mg via ORAL
  Filled 2012-06-18 (×8): qty 1

## 2012-06-18 MED ORDER — DIPHENHYDRAMINE HCL 25 MG PO CAPS: 25.0000 mg | ORAL_CAPSULE | ORAL | Status: DC | PRN

## 2012-06-18 MED ORDER — SODIUM CHLORIDE 0.9 % IJ SOLN: 3.0000 mL | INTRAMUSCULAR | Status: DC | PRN

## 2012-06-18 MED ORDER — SIMETHICONE 80 MG PO CHEW: 80.0000 mg | CHEWABLE_TABLET | ORAL | Status: DC | PRN

## 2012-06-18 MED ORDER — NALOXONE HCL 0.4 MG/ML IJ SOLN: 0.4000 mg | INTRAMUSCULAR | Status: DC | PRN

## 2012-06-18 MED ORDER — PHENYLEPHRINE 40 MCG/ML (10ML) SYRINGE FOR IV PUSH (FOR BLOOD PRESSURE SUPPORT)
PREFILLED_SYRINGE | INTRAVENOUS | Status: AC
  Filled 2012-06-18: qty 5

## 2012-06-18 MED ORDER — KETOROLAC TROMETHAMINE 60 MG/2ML IM SOLN
60.0000 mg | Freq: Once | INTRAMUSCULAR | Status: AC | PRN
  Administered 2012-06-18: 60 mg via INTRAMUSCULAR

## 2012-06-18 MED ORDER — TETANUS-DIPHTH-ACELL PERTUSSIS 5-2.5-18.5 LF-MCG/0.5 IM SUSP: 0.5000 mL | Freq: Once | INTRAMUSCULAR | Status: DC

## 2012-06-18 MED ORDER — SIMETHICONE 80 MG PO CHEW
80.0000 mg | CHEWABLE_TABLET | Freq: Three times a day (TID) | ORAL | Status: DC
  Administered 2012-06-18 – 2012-06-20 (×8): 80 mg via ORAL

## 2012-06-18 MED ORDER — MEPERIDINE HCL 25 MG/ML IJ SOLN: 6.2500 mg | INTRAMUSCULAR | Status: DC | PRN

## 2012-06-18 MED ORDER — WITCH HAZEL-GLYCERIN EX PADS: 1.0000 "application " | MEDICATED_PAD | CUTANEOUS | Status: DC | PRN

## 2012-06-18 MED ORDER — KETOROLAC TROMETHAMINE 60 MG/2ML IM SOLN
INTRAMUSCULAR | Status: AC
  Filled 2012-06-18: qty 2

## 2012-06-18 MED ORDER — LACTATED RINGERS IV SOLN
INTRAVENOUS | Status: DC
  Administered 2012-06-18: 1000 mL via INTRAVENOUS
  Administered 2012-06-18: 13:00:00 via INTRAVENOUS

## 2012-06-18 MED ORDER — ZOLPIDEM TARTRATE 5 MG PO TABS: 5.0000 mg | ORAL_TABLET | Freq: Every evening | ORAL | Status: DC | PRN

## 2012-06-18 MED ORDER — CEFAZOLIN SODIUM-DEXTROSE 2-3 GM-% IV SOLR
2.0000 g | INTRAVENOUS | Status: AC
  Administered 2012-06-18: 2 g via INTRAVENOUS
  Filled 2012-06-18: qty 50

## 2012-06-18 MED ORDER — ONDANSETRON HCL 4 MG/2ML IJ SOLN
INTRAMUSCULAR | Status: DC | PRN
  Administered 2012-06-18: 4 mg via INTRAVENOUS

## 2012-06-18 MED ORDER — FENTANYL CITRATE 0.05 MG/ML IJ SOLN
INTRAMUSCULAR | Status: AC
  Filled 2012-06-18: qty 2

## 2012-06-18 MED ORDER — OXYCODONE-ACETAMINOPHEN 5-325 MG PO TABS
1.0000 | ORAL_TABLET | ORAL | Status: DC | PRN
  Administered 2012-06-18 – 2012-06-19 (×5): 1 via ORAL
  Filled 2012-06-18 (×5): qty 1

## 2012-06-18 MED ORDER — LACTATED RINGERS IV SOLN
INTRAVENOUS | Status: DC | PRN
  Administered 2012-06-18: 03:00:00 via INTRAVENOUS

## 2012-06-18 MED ORDER — PROMETHAZINE HCL 25 MG/ML IJ SOLN: 6.2500 mg | INTRAMUSCULAR | Status: DC | PRN

## 2012-06-18 MED ORDER — KETOROLAC TROMETHAMINE 30 MG/ML IJ SOLN: 15.0000 mg | Freq: Once | INTRAMUSCULAR | Status: DC | PRN

## 2012-06-18 MED ORDER — METHYLERGONOVINE MALEATE 0.2 MG/ML IJ SOLN: 0.2000 mg | INTRAMUSCULAR | Status: DC | PRN

## 2012-06-18 MED ORDER — METHYLERGONOVINE MALEATE 0.2 MG PO TABS: 0.2000 mg | ORAL_TABLET | ORAL | Status: DC | PRN

## 2012-06-18 MED ORDER — OXYTOCIN 10 UNIT/ML IJ SOLN
INTRAMUSCULAR | Status: AC
  Filled 2012-06-18: qty 4

## 2012-06-18 MED ORDER — LACTATED RINGERS IV SOLN
INTRAVENOUS | Status: DC
  Administered 2012-06-18 (×3): via INTRAVENOUS

## 2012-06-18 MED ORDER — LANOLIN HYDROUS EX OINT: 1.0000 "application " | TOPICAL_OINTMENT | CUTANEOUS | Status: DC | PRN

## 2012-06-18 MED ORDER — DIPHENHYDRAMINE HCL 50 MG/ML IJ SOLN: 25.0000 mg | INTRAMUSCULAR | Status: DC | PRN

## 2012-06-18 MED ORDER — EPHEDRINE 5 MG/ML INJ
INTRAVENOUS | Status: AC
  Filled 2012-06-18: qty 10

## 2012-06-18 MED ORDER — SCOPOLAMINE 1 MG/3DAYS TD PT72
1.0000 | MEDICATED_PATCH | Freq: Once | TRANSDERMAL | Status: DC
  Administered 2012-06-18: 1.5 mg via TRANSDERMAL

## 2012-06-18 MED ORDER — PHENYLEPHRINE HCL 10 MG/ML IJ SOLN
INTRAMUSCULAR | Status: DC | PRN
  Administered 2012-06-18: 80 ug via INTRAVENOUS
  Administered 2012-06-18 (×2): 40 ug via INTRAVENOUS
  Administered 2012-06-18 (×3): 80 ug via INTRAVENOUS

## 2012-06-18 MED ORDER — DIPHENHYDRAMINE HCL 25 MG PO CAPS: 25.0000 mg | ORAL_CAPSULE | Freq: Four times a day (QID) | ORAL | Status: DC | PRN

## 2012-06-18 MED ORDER — MORPHINE SULFATE 0.5 MG/ML IJ SOLN
INTRAMUSCULAR | Status: AC
  Filled 2012-06-18: qty 10

## 2012-06-18 SURGICAL SUPPLY — 31 items
CLOTH BEACON ORANGE TIMEOUT ST (SAFETY) ×2 IMPLANT
DERMABOND ADVANCED (GAUZE/BANDAGES/DRESSINGS) ×1
DERMABOND ADVANCED .7 DNX12 (GAUZE/BANDAGES/DRESSINGS) ×1 IMPLANT
DRAPE SURG 17X23 STRL (DRAPES) ×2 IMPLANT
DRESSING TELFA 8X3 (GAUZE/BANDAGES/DRESSINGS) IMPLANT
DRSG COVADERM 4X10 (GAUZE/BANDAGES/DRESSINGS) IMPLANT
DURAPREP 26ML APPLICATOR (WOUND CARE) ×2 IMPLANT
ELECT REM PT RETURN 9FT ADLT (ELECTROSURGICAL) ×2
ELECTRODE REM PT RTRN 9FT ADLT (ELECTROSURGICAL) ×1 IMPLANT
EXTRACTOR VACUUM M CUP 4 TUBE (SUCTIONS) IMPLANT
GAUZE SPONGE 4X4 12PLY STRL LF (GAUZE/BANDAGES/DRESSINGS) IMPLANT
GLOVE BIO SURGEON STRL SZ7 (GLOVE) IMPLANT
GOWN PREVENTION PLUS LG XLONG (DISPOSABLE) ×4 IMPLANT
KIT ABG SYR 3ML LUER SLIP (SYRINGE) IMPLANT
NEEDLE HYPO 25X5/8 SAFETYGLIDE (NEEDLE) IMPLANT
NS IRRIG 1000ML POUR BTL (IV SOLUTION) ×2 IMPLANT
PACK C SECTION WH (CUSTOM PROCEDURE TRAY) ×2 IMPLANT
PAD ABD 7.5X8 STRL (GAUZE/BANDAGES/DRESSINGS) IMPLANT
PAD OB MATERNITY 4.3X12.25 (PERSONAL CARE ITEMS) ×2 IMPLANT
RTRCTR C-SECT PINK 25CM LRG (MISCELLANEOUS) ×2 IMPLANT
RTRCTR C-SECT PINK 34CM XLRG (MISCELLANEOUS) IMPLANT
SLEEVE SCD COMPRESS KNEE MED (MISCELLANEOUS) ×2 IMPLANT
STAPLER VISISTAT 35W (STAPLE) IMPLANT
SUT CHROMIC 1 CTX 36 (SUTURE) ×6 IMPLANT
SUT PDS AB 0 CTX 60 (SUTURE) ×2 IMPLANT
SUT VIC AB 2-0 CT1 27 (SUTURE) ×1
SUT VIC AB 2-0 CT1 TAPERPNT 27 (SUTURE) ×1 IMPLANT
SUT VIC AB 4-0 KS 27 (SUTURE) ×2 IMPLANT
TOWEL OR 17X24 6PK STRL BLUE (TOWEL DISPOSABLE) ×4 IMPLANT
TRAY FOLEY CATH 14FR (SET/KITS/TRAYS/PACK) ×2 IMPLANT
WATER STERILE IRR 1000ML POUR (IV SOLUTION) ×2 IMPLANT

## 2012-06-18 NOTE — Addendum Note (Signed)
Addendum  created 06/18/12 1135 by Shanon Payor, CRNA   Modules edited:Notes Section

## 2012-06-18 NOTE — Anesthesia Postprocedure Evaluation (Signed)
Anesthesia Post Note  Patient: Tracey Chen  Procedure(s) Performed: Procedure(s) (LRB): CESAREAN SECTION (N/A)  Anesthesia type: Spinal  Patient location: PACU  Post pain: Pain level controlled  Post assessment: Post-op Vital signs reviewed  Last Vitals:  Filed Vitals:   06/18/12 0345  BP: 102/49  Pulse: 96  Temp: 36.7 C  Resp: 20    Post vital signs: Reviewed  Level of consciousness: awake  Complications: No apparent anesthesia complications

## 2012-06-18 NOTE — Progress Notes (Signed)
UR Chart review completed.  

## 2012-06-18 NOTE — Op Note (Signed)
Pre-Operative Diagnosis: 1) 38+3 week intrauterine pregnancy 2) labor 3) history of prior cesarean section, declines trial of labor Postoperative Diagnosis: Same Procedure: Repeat Cesarean Section via Pfannenstiel Skin Incision Surgeon: Dr. Waynard Reeds Assistant: None Operative Findings: Vigorous female infant in vertex presentation with apgars of 9 & 9. Normal appearing ovaries, tubes, and uterus.  Weight pending Specimen: Placenta for disposal EBL: Total I/O In: 500 [I.V.:500] Out: 820 [Urine:120; Blood:700]   Procedure:Tracey Chen is an 26 year old gravida 2 para 1001 at 59 weeks and 3 days estimated gestational age who presents for cesarean section. The patient presented to MAU with painful, regular contractions and was found to be 4 cm.  She was felt to be in labor and declined a trial of labor and wished to proceed with repeat cesarean section.  Following the appropriate informed consent the patient was brought to the operating room where spinal anesthesia was administered and found to be adequate. She was placed in the dorsal supine position with a leftward tilt. She was prepped and draped in the normal sterile fashion. Scalpel was then used to make a Pfannenstiel skin incision which was carried down to the underlying layers of soft tissue to the fascia. The fascia was incised in the midline and the fascial incision was extended laterally with Mayo scissors. The superior aspect of the fascial incision was grasped with Coker clamps x2, tented up and the rectus muscles dissected off sharply with the electrocautery unit area and the same procedure was repeated on the inferior aspect of the fascial incision. The rectus muscles were separated in the midline. The abdominal peritoneum was identified, tented up, entered sharply, and the incision was extended superiorly and inferiorly with good visualization of the bladder. The Alexis retractor was then deployed. The vesicouterine peritoneum was identified,  tented up, entered sharply, and the bladder flap was created digitally. Scalpel was then used to make a low transverse incision on the uterus which was extended laterally with both blunt dissection and the bandage scissors. The fetal vertex was identified, delivered easily through the uterine incision followed by the body. The infant was bulb suctioned on the operative field cried vigorously, cord was clamped and cut and the infant was passed to the waiting neonatologist. Placenta was then delivered spontaneously, the uterus was cleared of all clot and debris. The uterine incision was repaired with #1 chromic in running locked fashion followed by a second imbricating layer. Ovaries and tubes were inspected and normal. The Alexis retractor was removed. The uterus was returned to the abdominal cavity the abdominal cavity was cleared of all clot and debris. The abdominal peritoneum was reapproximated with 2-0 Vicryl in a running fashion, the rectus muscles was reapproximated with #1 chromic in a running fashion. The fascia was closed with a looped PDS in a running fashion. The skin was closed with 4-0 vicryl in a subcuticular fashion and Dermabond. All sponge lap and needle counts were correct x2. Patient tolerated the procedure well and recovered in stable condition following the procedure.

## 2012-06-18 NOTE — Anesthesia Procedure Notes (Signed)
Spinal  Patient location during procedure: OR Start time: 06/18/2012 2:27 AM End time: 06/18/2012 2:31 AM Staffing Anesthesiologist: Sandrea Hughs Performed by: anesthesiologist  Preanesthetic Checklist Completed: patient identified, site marked, surgical consent, pre-op evaluation, timeout performed, IV checked, risks and benefits discussed and monitors and equipment checked Spinal Block Patient position: sitting Prep: DuraPrep Patient monitoring: heart rate, cardiac monitor, continuous pulse ox and blood pressure Approach: midline Location: L3-4 Injection technique: single-shot Needle Needle type: Sprotte  Needle gauge: 24 G Needle length: 9 cm Needle insertion depth: 5 cm Assessment Sensory level: T4

## 2012-06-18 NOTE — Anesthesia Postprocedure Evaluation (Signed)
  Anesthesia Post-op Note  Patient: Tracey Chen  Procedure(s) Performed: Procedure(s) (LRB) with comments: CESAREAN SECTION (N/A) - Repeat cesarean section of baby girl at 60  APGAR 9/9                                                                      Patient Location: Mother/Baby  Anesthesia Type:Spinal  Level of Consciousness: awake, alert  and oriented  Airway and Oxygen Therapy: Patient Spontanous Breathing  Post-op Pain: none  Post-op Assessment: Post-op Vital signs reviewed and Patient's Cardiovascular Status Stable  Post-op Vital Signs: Reviewed and stable  Complications: No apparent anesthesia complications

## 2012-06-18 NOTE — H&P (Signed)
Tracey Chen is a 26 y.o. female presenting for contractions 26 yo G2P1001 @ 38+3 presents with painful, regular uterine contractions.  The patient has a h/o a prior cesarean section for macrosomia and declines a trial of labor.  Cervix is 4 cm dilated.  Will proceed with cesarean section. History OB History    Grav Para Term Preterm Abortions TAB SAB Ect Mult Living   2 1 1       1      Past Medical History  Diagnosis Date  . Anemia   . Anxiety     treated with lexapro at age 58  . Pregnancy induced hypertension 2012  . Ringworm   . Eczema    Past Surgical History  Procedure Date  . Cesarean section   . Wisdom tooth extraction    Family History: family history includes Mental illness in her mother and Thyroid disease in her mother.  There is no history of Asthma, and Cancer, and Heart disease, and Stroke, . Social History:  reports that she quit smoking about 7 months ago. Her smoking use included Cigarettes. She smoked .5 packs per day. She does not have any smokeless tobacco history on file. She reports that she does not drink alcohol or use illicit drugs.   Prenatal Transfer Tool  Maternal Diabetes: No Genetic Screening: Normal Maternal Ultrasounds/Referrals: Normal Fetal Ultrasounds or other Referrals:  None Maternal Substance Abuse:  No Significant Maternal Medications:  None Significant Maternal Lab Results:  None Other Comments:  None  ROS: as above  Dilation: 4 Effacement (%): 50 Station: -3 Exam by:: Lucy Chris RNC/Danielle Simpson RN Blood pressure 106/61, pulse 107, temperature 98.5 F (36.9 C), temperature source Oral, resp. rate 18, height 5\' 5"  (1.651 m), weight 84.823 kg (187 lb), last menstrual period 08/27/2011, SpO2 100.00%, unknown if currently breastfeeding. Exam Physical Exam  Prenatal labs: ABO, Rh:  A + Antibody: Negative (04/18 0000) Rubella:  Immune RPR:   NR HBsAg:   Neg HIV: Non-reactive (04/18 0000)  GBS: Negative (10/08 0000)    Assessment/Plan: 1) Admit 2) Proceed with repeat cesarean section.  R/B/A reviewed.  Pt declines trial of labor   Tracey Chen H. 06/18/2012, 1:06 AM

## 2012-06-18 NOTE — Anesthesia Preprocedure Evaluation (Signed)
Anesthesia Evaluation  Patient identified by MRN, date of birth, ID band Patient awake    Reviewed: Allergy & Precautions, H&P , NPO status , Patient's Chart, lab work & pertinent test results  Airway Mallampati: II TM Distance: >3 FB Neck ROM: full    Dental No notable dental hx.    Pulmonary neg pulmonary ROS,    Pulmonary exam normal       Cardiovascular     Neuro/Psych negative neurological ROS     GI/Hepatic negative GI ROS, Neg liver ROS,   Endo/Other  negative endocrine ROS  Renal/GU negative Renal ROS  negative genitourinary   Musculoskeletal negative musculoskeletal ROS (+)   Abdominal Normal abdominal exam  (+)   Peds negative pediatric ROS (+)  Hematology negative hematology ROS (+)   Anesthesia Other Findings   Reproductive/Obstetrics (+) Pregnancy                           Anesthesia Physical Anesthesia Plan  ASA: II and Emergent  Anesthesia Plan: Spinal   Post-op Pain Management:    Induction:   Airway Management Planned:   Additional Equipment:   Intra-op Plan:   Post-operative Plan:   Informed Consent: I have reviewed the patients History and Physical, chart, labs and discussed the procedure including the risks, benefits and alternatives for the proposed anesthesia with the patient or authorized representative who has indicated his/her understanding and acceptance.     Plan Discussed with: CRNA and Surgeon  Anesthesia Plan Comments:         Anesthesia Quick Evaluation

## 2012-06-18 NOTE — Transfer of Care (Signed)
Immediate Anesthesia Transfer of Care Note  Patient: Tracey Chen  Procedure(s) Performed: Procedure(s) (LRB) with comments: CESAREAN SECTION (N/A) - Repeat cesarean section of baby girl at 57  APGAR 9/9                                                                      Patient Location: PACU  Anesthesia Type:Spinal  Level of Consciousness: awake, alert  and oriented  Airway & Oxygen Therapy: Patient Spontanous Breathing  Post-op Assessment: Report given to PACU RN and Post -op Vital signs reviewed and stable  Post vital signs: Reviewed and stable  Complications: No apparent anesthesia complications

## 2012-06-19 ENCOUNTER — Encounter (HOSPITAL_COMMUNITY): Payer: Self-pay | Admitting: Obstetrics and Gynecology

## 2012-06-19 LAB — CBC
MCV: 93 fL (ref 78.0–100.0)
Platelets: 290 10*3/uL (ref 150–400)
RDW: 15.5 % (ref 11.5–15.5)
WBC: 17.5 10*3/uL — ABNORMAL HIGH (ref 4.0–10.5)

## 2012-06-19 LAB — TYPE AND SCREEN: Unit division: 0

## 2012-06-19 NOTE — Progress Notes (Signed)
Patient is eating, ambulating, voiding.  Pain control is good. Appropriate lochia. + flatus  Filed Vitals:   06/18/12 1612 06/18/12 1937 06/19/12 0020 06/19/12 0502  BP: 95/52 103/62 92/51 103/59  Pulse: 69 74 71 72  Temp: 98.1 F (36.7 C) 98.9 F (37.2 C) 97.8 F (36.6 C) 97.5 F (36.4 C)  TempSrc:  Oral Oral Axillary  Resp: 18 18 18 18   Height:      Weight:      SpO2: 97% 98% 96%     Fundus firm Incision: c/d/i  Lab Results  Component Value Date   WBC 17.5* 06/19/2012   HGB 8.4* 06/19/2012   HCT 25.4* 06/19/2012   MCV 93.0 06/19/2012   PLT 290 06/19/2012    --/--/A POS, A POS (10/29 0120)/RI  A/P Post op day 1 s/p repeat c/s. Anemia- FeSO4 bid  Routine care.   Philip Aspen

## 2012-06-19 NOTE — Progress Notes (Signed)

## 2012-06-20 ENCOUNTER — Other Ambulatory Visit (HOSPITAL_COMMUNITY): Payer: Medicaid Other

## 2012-06-20 MED ORDER — IBUPROFEN 600 MG PO TABS: 600.0000 mg | ORAL_TABLET | Freq: Four times a day (QID) | ORAL | Status: DC

## 2012-06-20 MED ORDER — OXYCODONE-ACETAMINOPHEN 5-325 MG PO TABS: 1.0000 | ORAL_TABLET | ORAL | Status: DC | PRN

## 2012-06-20 NOTE — Discharge Summary (Signed)
Obstetric Discharge Summary Reason for Admission: onset of labor Prenatal Procedures: ultrasound Intrapartum Procedures: cesarean: low cervical, transverse, repeat Postpartum Procedures: none Complications-Operative and Postpartum: none Hemoglobin  Date Value Range Status  06/19/2012 8.4* 12.0 - 15.0 g/dL Final     HCT  Date Value Range Status  06/19/2012 25.4* 36.0 - 46.0 % Final    Physical Exam:  General: alert Lochia: appropriate Uterine Fundus: firm Incision: healing well DVT Evaluation: No evidence of DVT seen on physical exam.  Discharge Diagnoses: Term Pregnancy-delivered  Discharge Information: Date: 06/20/2012 Activity: pelvic rest Diet: routine Medications: PNV, Ibuprofen and Percocet Condition: stable Instructions: refer to practice specific booklet Discharge to: home Follow-up Information    Follow up with Almon Hercules., MD. Schedule an appointment as soon as possible for a visit in 4 weeks.   Contact information:   [address] [place] [address] [phone]          Newborn Data: Live born female  Birth Weight: 6 lb 10.5 oz (3020 g) APGAR: 9, 9  Home with mother.  ANDERSON,MARK E 06/20/2012, 8:20 AM

## 2012-06-20 NOTE — Progress Notes (Signed)
POD#2 Pt without c/o. Would like to go home. VSSAF IMP/stable PLAN/discharge

## 2012-06-25 ENCOUNTER — Encounter (HOSPITAL_COMMUNITY): Admission: RE | Payer: Self-pay | Source: Ambulatory Visit

## 2012-06-25 ENCOUNTER — Inpatient Hospital Stay (HOSPITAL_COMMUNITY)
Admission: RE | Admit: 2012-06-25 | Payer: Medicaid Other | Source: Ambulatory Visit | Admitting: Obstetrics and Gynecology

## 2012-06-25 ENCOUNTER — Ambulatory Visit (HOSPITAL_COMMUNITY): Admission: RE | Admit: 2012-06-25 | Payer: Medicaid Other | Source: Ambulatory Visit

## 2012-06-25 SURGERY — Surgical Case
Anesthesia: Regional

## 2014-06-22 ENCOUNTER — Encounter (HOSPITAL_COMMUNITY): Payer: Self-pay | Admitting: Obstetrics and Gynecology

## 2014-11-23 ENCOUNTER — Emergency Department (HOSPITAL_COMMUNITY)
Admission: EM | Admit: 2014-11-23 | Discharge: 2014-11-23 | Disposition: A | Discharge status: Home / Self Care | Payer: Medicaid Other | Attending: Emergency Medicine | Admitting: Emergency Medicine

## 2014-11-23 ENCOUNTER — Encounter (HOSPITAL_COMMUNITY): Payer: Self-pay | Admitting: Emergency Medicine

## 2014-11-23 ENCOUNTER — Emergency Department (HOSPITAL_COMMUNITY): Payer: Medicaid Other

## 2014-11-23 DIAGNOSIS — Z862 Personal history of diseases of the blood and blood-forming organs and certain disorders involving the immune mechanism: Secondary | ICD-10-CM | POA: Insufficient documentation

## 2014-11-23 DIAGNOSIS — Z8619 Personal history of other infectious and parasitic diseases: Secondary | ICD-10-CM | POA: Insufficient documentation

## 2014-11-23 DIAGNOSIS — Z872 Personal history of diseases of the skin and subcutaneous tissue: Secondary | ICD-10-CM | POA: Insufficient documentation

## 2014-11-23 DIAGNOSIS — Z87891 Personal history of nicotine dependence: Secondary | ICD-10-CM | POA: Insufficient documentation

## 2014-11-23 DIAGNOSIS — Z91040 Latex allergy status: Secondary | ICD-10-CM | POA: Insufficient documentation

## 2014-11-23 DIAGNOSIS — M5440 Lumbago with sciatica, unspecified side: Secondary | ICD-10-CM | POA: Insufficient documentation

## 2014-11-23 DIAGNOSIS — M5430 Sciatica, unspecified side: Secondary | ICD-10-CM

## 2014-11-23 DIAGNOSIS — Z79899 Other long term (current) drug therapy: Secondary | ICD-10-CM | POA: Insufficient documentation

## 2014-11-23 DIAGNOSIS — Z8659 Personal history of other mental and behavioral disorders: Secondary | ICD-10-CM | POA: Insufficient documentation

## 2014-11-23 MED ORDER — PREDNISONE 20 MG PO TABS
60.0000 mg | ORAL_TABLET | Freq: Once | ORAL | Status: AC
  Administered 2014-11-23: 60 mg via ORAL
  Filled 2014-11-23: qty 3

## 2014-11-23 MED ORDER — CYCLOBENZAPRINE HCL 5 MG PO TABS: 5.0000 mg | ORAL_TABLET | Freq: Once | ORAL | Status: DC

## 2014-11-23 MED ORDER — PREDNISONE 20 MG PO TABS: ORAL_TABLET | ORAL | Status: DC

## 2014-11-23 MED ORDER — HYDROCODONE-ACETAMINOPHEN 5-325 MG PO TABS: 1.0000 | ORAL_TABLET | Freq: Every evening | ORAL | Status: DC | PRN

## 2014-11-23 MED ORDER — CYCLOBENZAPRINE HCL 10 MG PO TABS
5.0000 mg | ORAL_TABLET | Freq: Once | ORAL | Status: AC
  Administered 2014-11-23: 5 mg via ORAL
  Filled 2014-11-23: qty 1

## 2014-11-23 NOTE — ED Notes (Signed)
Pt c/o low back pain x 2 months.  States that she has had 2 children and had epidurals so she thinks it might have to do something with that.  Pt also c/o cold sx x 4 days ago.

## 2014-11-23 NOTE — Discharge Instructions (Signed)
Tonight your x-ray is normal.  As discussed, I started you on the back pain protocol which includes prednisone, which is a very potent anti-inflammatory, please do not take any additional Advil or ibuprofen with this but you can take additional Tylenol as needed.  He also been given a muscle relaxer in the form of Flexeril this is 1 tablet 3 times a day and U been given a prescription for Vicodin that you can take at night with one repeat as needed.  You've also been given a set of exercises that you can start doing to help strengthen the core, as well as a referral to Dr. Ophelia CharterYates who is the orthopedic specialist for further evaluation.  If this treatment regime does not alleviate her pain

## 2014-11-23 NOTE — ED Provider Notes (Signed)
CSN: [id number]     Arrival date & time 11/23/14  1826 History   None    Chief Complaint  Patient presents with  . Back Pain  . Nasal Congestion   Patient is a 29 y.o. female presenting with back pain. The history is provided by the patient. No language interpreter was used.  Back Pain  This chart was scribed for nurse practitioner Earley FavorGail Elleana Stillson, NP working with No att. providers found, by Andrew Auaven Small, ED Scribe. This patient was seen in room WTR5/WTR5 and the patient's care was started at 8:25 PM.  Tracey Chen is a 29 y.o. female who presents to the Emergency Department complaining of non radiating low back pain onset 2 months. Pt reports she's had dull back pain for 2 years that has intensified with spasms persistently for the past 2 months ago. She denies injury to low back. 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 Pt had an epidural 2 and 4 years ago which she believes initially caused the dull low back pain. Pt has been taking ibuprofen and stretching. Pt is on mirena.   Past Medical History  Diagnosis Date  . Anemia   . Anxiety     treated with lexapro at age 29  . Pregnancy induced hypertension 2012  . Ringworm   . Eczema    Past Surgical History  Procedure Laterality Date  . Cesarean section    . Wisdom tooth extraction    . Cesarean section  06/18/2012    Procedure: CESAREAN SECTION;  Surgeon: Freddrick MarchKendra H. Tenny Crawoss, MD;  Location: [facility];  Service: Obstetrics;  Laterality: N/A;  Repeat cesarean section of baby girl at 0256  APGAR 9/9                                                                       Family History  Problem Relation Age of Onset  . Thyroid disease Mother   . Mental illness Mother     PTSD from husband's death  . Asthma Neg Hx   . Cancer Neg Hx   . Heart disease Neg Hx   . Stroke Neg Hx    History  Substance Use Topics  .  Smoking status: Former Smoker -- 0.50 packs/day    Types: Cigarettes    Quit date: 11/03/2011  . Smokeless tobacco: Not on file  . Alcohol Use: No   OB History    Gravida Para Term Preterm AB TAB SAB Ectopic Multiple Living   2 2 2  0 0 0 0 0 0 2     Review of Systems  Musculoskeletal: Positive for back pain.      Allergies  Latex  Home Medications   Prior to Admission medications   Medication Sig Start Date End Date Taking? Authorizing Provider  famotidine (PEPCID AC) 10 MG chewable tablet Chew 10 mg by mouth at bedtime.     Historical Provider, MD  ibuprofen (ADVIL,MOTRIN) 600 MG tablet Take 1 tablet (600 mg total) by mouth every 6 (six) hours. 06/20/12   Levi AlandMark E Anderson, MD  oxyCODONE-acetaminophen (PERCOCET/ROXICET) 5-325 MG per tablet Take 1-2 tablets by mouth every 4 (four) hours as  needed (moderate - severe pain). 06/20/12   Levi Aland, MD  Prenatal Vit-Fe Fumarate-FA (PRENATAL MULTIVITAMIN) TABS Take 1 tablet by mouth every morning.    Historical Provider, MD  pyridOXINE (VITAMIN B-6) 100 MG tablet Take 100 mg by mouth daily.    Historical Provider, MD   BP 133/76 mmHg  Pulse 80  Temp(Src) 98.6 F (37 C) (Oral)  Resp 16  SpO2 99% Physical Exam  Constitutional: She is oriented to person, place, and time. She appears well-developed and well-nourished. No distress.  HENT:  Head: Normocephalic and atraumatic.  Eyes: Conjunctivae and EOM are normal.  Neck: Neck supple.  Cardiovascular: Normal rate.   Pulmonary/Chest: Effort normal.  Musculoskeletal: Normal range of motion.  Neurological: She is alert and oriented to person, place, and time.  Skin: Skin is warm and dry.  Psychiatric: She has a normal mood and affect. Her behavior is normal.  Nursing note and vitals reviewed.   ED Course  Procedures (including critical care time) DIAGNOSTIC STUDIES: Oxygen Saturation is 99% on RA, normal by my interpretation.    COORDINATION OF CARE: 8:25 PM- Pt advised  of plan for treatment and pt agrees., Labs Review Labs Reviewed - No data to display  Imaging Review No results found.   EKG Interpretation None     patient's x-ray is normal.  We have discussed prior to the results of x-ray medical management of low back pain.  She is agreeable to this.  She has been given a 12 day steroid taper, Flexeril, and Vicodin for nighttime plus a set of exercises that she can use if this does not work.  Patient will make appointment with orthopedics for further evaluation  MDM   Final diagnoses:  None   I personally performed the services described in this documentation, which was scribed in my presence. The recorded information has been reviewed and is accurate.    Earley Favor, NP 11/23/14 0454  Rolland Porter, MD 11/27/14 1000

## 2017-04-23 ENCOUNTER — Emergency Department (HOSPITAL_COMMUNITY): Payer: Self-pay

## 2017-04-23 ENCOUNTER — Encounter (HOSPITAL_COMMUNITY): Payer: Self-pay | Admitting: Emergency Medicine

## 2017-04-23 ENCOUNTER — Emergency Department (HOSPITAL_COMMUNITY)
Admission: EM | Admit: 2017-04-23 | Discharge: 2017-04-23 | Disposition: A | Discharge status: Home / Self Care | Payer: Self-pay | Attending: Emergency Medicine | Admitting: Emergency Medicine

## 2017-04-23 DIAGNOSIS — Z91040 Latex allergy status: Secondary | ICD-10-CM | POA: Insufficient documentation

## 2017-04-23 DIAGNOSIS — Z87891 Personal history of nicotine dependence: Secondary | ICD-10-CM | POA: Insufficient documentation

## 2017-04-23 DIAGNOSIS — J40 Bronchitis, not specified as acute or chronic: Secondary | ICD-10-CM | POA: Insufficient documentation

## 2017-04-23 DIAGNOSIS — Z79899 Other long term (current) drug therapy: Secondary | ICD-10-CM | POA: Insufficient documentation

## 2017-04-23 MED ORDER — ALBUTEROL SULFATE HFA 108 (90 BASE) MCG/ACT IN AERS: 1.0000 | INHALATION_SPRAY | Freq: Four times a day (QID) | RESPIRATORY_TRACT | 0 refills | Status: DC | PRN

## 2017-04-23 MED ORDER — HYDROCODONE-HOMATROPINE 5-1.5 MG/5ML PO SYRP: 5.0000 mL | ORAL_SOLUTION | Freq: Four times a day (QID) | ORAL | 0 refills | Status: DC | PRN

## 2017-04-23 MED ORDER — IPRATROPIUM-ALBUTEROL 0.5-2.5 (3) MG/3ML IN SOLN
3.0000 mL | Freq: Once | RESPIRATORY_TRACT | Status: AC
  Administered 2017-04-23: 3 mL via RESPIRATORY_TRACT
  Filled 2017-04-23: qty 3

## 2017-04-23 NOTE — Discharge Instructions (Signed)
You were evaluated in the emergency department for cough, chest tightness, wheezing and sore throat. Your x-ray was normal. I suspect you have an upper respiratory infection from a virus that is starting to go down into your lower airways. We will treat your symptoms with Flonase for nasal congestion, ibuprofen/Tylenol for sore throat and body aches, albuterol inhaler for wheezing and chest tightness, Hycodan syrup at nighttime only for cough suppression and chest wall pain from coughing. During the day I recommend you take Mucinex to help you cough without mucus. Drink plenty of water and sleep as much as he can to support your body and biting this infection. A viral upper respiratory infection can always progress into a bacterial infection and/or pneumonia. Please return to the ED via her symptoms worsen, you develop fever, shortness of breath, chest pain.  Hycodan syrup has hydrocodone in it.  Hydrocodone is a narcotic pain medication that has risk of overdose, death, dependence and abuse. Do not consume alcohol, drive or use heavy machinery while taking this medication. Do not leave unattended around children. The emergency department has a strict policy regarding prescription of narcotic medications. We are unable to refill this medication in the emergency department for chronic pain. Contact your primary care provider or specialist for chronic pain management and refill on narcotic medications.

## 2017-04-23 NOTE — ED Triage Notes (Signed)
Pt c/o worsening cough x several weeks, productive at times, pt states she has mild wheezing. Mild wheezing and decreased base noted in triage assessment. Pt sats 98% RA.

## 2017-04-23 NOTE — ED Provider Notes (Signed)
[facility]-EMERGENCY DEPT Provider Note   CSN: [id number] Arrival date & time: 04/23/17  1244     History   Chief Complaint Chief Complaint  Patient presents with  . Cough    HPI Tracey Chen is a 31 y.o. female presents to the ED for evaluation of what sounding cough, wheezing, chest tightness, chest pain secondary to cough, nasal congestion, sore throat 2 weeks. Unsure about fever. Was sent to the ED from work because she couldn't stop coughing. Has tried allergy medication over-the-counter which has not helped. She smokes 5-10 cigarettes every day. No history of asthma.  HPI  Past Medical History:  Diagnosis Date  . Anemia   . Anxiety    treated with lexapro at age 64  . Eczema   . Pregnancy induced hypertension 2012  . Ringworm     Patient Active Problem List   Diagnosis Date Noted  . History of cesarean delivery, currently pregnant 05/26/2012  . 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 Anemia 11/09/2010  . Pregnancy induced hypertension 11/09/2010  . ANXIETY 06/01/2008  . ECZEMA 06/01/2008    Past Surgical History:  Procedure Laterality Date  . CESAREAN SECTION    . CESAREAN SECTION  06/18/2012   Procedure: CESAREAN SECTION;  Surgeon: Freddrick March. Tenny Craw, MD;  Location: [facility];  Service: Obstetrics;  Laterality: N/A;  Repeat cesarean section of baby girl at 0256  APGAR 9/9                                                                      . WISDOM TOOTH EXTRACTION      OB History    Gravida Para Term Preterm AB Living   2 2 2  0 0 2   SAB TAB Ectopic Multiple Live Births   0 0 0 0 2       Home Medications    Prior to Admission medications   Medication Sig Start Date End Date Taking? Authorizing Provider  albuterol (PROVENTIL HFA;VENTOLIN HFA) 108 (90 Base) MCG/ACT inhaler Inhale 1-2 puffs into the lungs every 6 (six) hours as needed for wheezing or shortness of breath. 04/23/17   Liberty Handy,  PA-C  cyclobenzaprine (FLEXERIL) 5 MG tablet Take 1 tablet (5 mg total) by mouth once. 11/23/14   Earley Favor, NP  guaiFENesin (MUCINEX) 600 MG 12 hr tablet Take 600 mg by mouth 2 (two) times daily.    [provider]  HYDROcodone-acetaminophen (NORCO/VICODIN) 5-325 MG per tablet Take 1-2 tablets by mouth at bedtime as needed and may repeat dose one time if needed for moderate pain. 11/23/14   Earley Favor, NP  HYDROcodone-homatropine [facility]) 5-1.5 MG/5ML syrup Take 5 mLs by mouth every 6 (six) hours as needed for cough. FOR DISRUPTIVE NIGHT TIME COUGH AND BODY ACHES 04/23/17   Liberty Handy, PA-C  ibuprofen (ADVIL,MOTRIN) 200 MG tablet Take 800 mg by mouth every 6 (six) hours as needed for moderate pain.    [provider]  ibuprofen (ADVIL,MOTRIN) 600 MG tablet Take 1 tablet (600 mg total) by mouth every 6 (six) hours. Patient not taking: Reported on 11/23/2014 06/20/12   Levi Aland, MD  oxyCODONE-acetaminophen (PERCOCET/ROXICET) 5-325 MG per tablet Take 1-2 tablets by mouth every 4 (four) hours as  needed (moderate - severe pain). Patient not taking: Reported on 11/23/2014 06/20/12   Levi Aland, MD  predniSONE (DELTASONE) 20 MG tablet 3 Tabs PO Days 1-3, then 2 tabs PO Days 4-6, then 1 tab PO Day 7-9, then Half Tab PO Day 10-12 11/23/14   Earley Favor, NP    Family History Family History  Problem Relation Age of Onset  . Thyroid disease Mother   . Mental illness Mother        PTSD from husband's death  . Asthma Neg Hx   . Cancer Neg Hx   . Heart disease Neg Hx   . Stroke Neg Hx     Social History Social History  Substance Use Topics  . Smoking status: Former Smoker    Packs/day: 0.50    Types: Cigarettes    Quit date: 11/03/2011  . Smokeless tobacco: Never Used  . Alcohol use No     Allergies   Latex   Review of Systems Review of Systems  Constitutional: Negative for chills, diaphoresis and fever.  HENT: Positive for congestion, postnasal drip,  rhinorrhea and sore throat.   Respiratory: Positive for cough and chest tightness. Negative for shortness of breath.   Cardiovascular: Negative for chest pain.  Gastrointestinal: Negative for abdominal pain, nausea and vomiting.  Musculoskeletal: Negative for back pain.     Physical Exam Updated Vital Signs BP 130/83 (BP Location: Left Arm)   Pulse 67   Temp 99 F (37.2 C) (Oral)   Resp 16   SpO2 99%   Physical Exam  Constitutional: She is oriented to person, place, and time. She appears well-developed and well-nourished. No distress.  NAD.  HENT:  Head: Normocephalic and atraumatic.  Right Ear: External ear normal.  Left Ear: External ear normal.  Nose: Mucosal edema and rhinorrhea present.  Mouth/Throat: Mucous membranes are normal. Posterior oropharyngeal erythema present. Tonsils are 0 on the right. Tonsils are 0 on the left.  Moderate mucosal edema with rhinorrhea Oropharynx is erythematous without edema or petechiae Tonsils mildly erythematous without edema, asymmetry, exudates Uvula is midline No trismus  Eyes: Conjunctivae and EOM are normal. No scleral icterus.  Neck: Normal range of motion. Neck supple.  Cardiovascular: Normal rate, regular rhythm and normal heart sounds.   No murmur heard. Pulmonary/Chest: Effort normal. She has no decreased breath sounds. She has wheezes.  Diffuse expiratory wheezing mostly at upper and middle fields bilaterally No tachypnea or hypoxia  Musculoskeletal: Normal range of motion. She exhibits no deformity.  Neurological: She is alert and oriented to person, place, and time.  Skin: Skin is warm and dry. Capillary refill takes less than 2 seconds.  Psychiatric: She has a normal mood and affect. Her behavior is normal. Judgment and thought content normal.  Nursing note and vitals reviewed.    ED Treatments / Results  Labs (all labs ordered are listed, but only abnormal results are displayed) Labs Reviewed - No data to  display  EKG  EKG Interpretation None       Radiology Dg Chest 2 View  Result Date: 04/23/2017 CLINICAL DATA:  Cough EXAM: CHEST  2 VIEW COMPARISON:  None. FINDINGS: Normal heart size. Normal mediastinal contour. No pneumothorax. No pleural effusion. Lungs appear clear, with no acute consolidative airspace disease and no pulmonary edema. IMPRESSION: No active cardiopulmonary disease. Electronically Signed   By: Delbert Phenix M.D.   On: 04/23/2017 13:38    Procedures Procedures (including critical care time)  Medications Ordered in ED Medications  ipratropium-albuterol (DUONEB) 0.5-2.5 (3) MG/3ML nebulizer solution 3 mL (3 mLs Nebulization Given 04/23/17 1506)     Initial Impression / Assessment and Plan / ED Course  I have reviewed the triage vital signs and the nursing notes.  Pertinent labs & imaging results that were available during my care of the patient were reviewed by me and considered in my medical decision making (see chart for details).    31 y.o. -year-old female with pertinent pmh of tobacco abuse presents to the ED with nasal congestion, rhinorrhea, productive cough, wheezing, chest tightness, sore throat x2 weeks. On my exam patient is nontoxic appearing, speaking in full sentences. No fever, no tachypnea, no tachycardia, normal oxygen saturations.  She has expiratory wheezing, nonfocal. Triage x-rays negative for PNA.  Hx and exam not c/w PE, PERC negative.  I doubt bacterial bronchitis, pneumonia, PE. I think that symptoms can be treated conservatively at this point. Given reassuring physical exam and vital signs within normal limits patient will be discharged with symptomatic treatment including rest, fluids, Flonase, Mucinex, Hycodan, albuterol inhaler. Pt educated on smoking cessation.. Strict ED return precautions given. Patient is aware that a viral URI infection may precede the onset of pneumonia. Patient is aware of red flag symptoms to monitor for that would warrant  return to the ED for further reevaluation.    Final Clinical Impressions(s) / ED Diagnoses   Final diagnoses:  Bronchitis    New Prescriptions New Prescriptions   ALBUTEROL (PROVENTIL HFA;VENTOLIN HFA) 108 (90 BASE) MCG/ACT INHALER    Inhale 1-2 puffs into the lungs every 6 (six) hours as needed for wheezing or shortness of breath.   HYDROCODONE-HOMATROPINE (HYCODAN) 5-1.5 MG/5ML SYRUP    Take 5 mLs by mouth every 6 (six) hours as needed for cough. FOR DISRUPTIVE NIGHT TIME COUGH AND BODY ACHES     Liberty HandyGibbons, Kirk Sampley J, PA-C 04/23/17 1549    Lorre NickAllen, Anthony, MD 04/26/17 [phone]

## 2017-05-25 ENCOUNTER — Encounter (HOSPITAL_COMMUNITY): Payer: Self-pay | Admitting: Emergency Medicine

## 2017-05-25 ENCOUNTER — Emergency Department (HOSPITAL_COMMUNITY): Payer: Self-pay

## 2017-05-25 ENCOUNTER — Emergency Department (HOSPITAL_COMMUNITY)
Admission: EM | Admit: 2017-05-25 | Discharge: 2017-05-26 | Disposition: A | Discharge status: Home / Self Care | Payer: Self-pay | Attending: Emergency Medicine | Admitting: Emergency Medicine

## 2017-05-25 DIAGNOSIS — Z79899 Other long term (current) drug therapy: Secondary | ICD-10-CM | POA: Insufficient documentation

## 2017-05-25 DIAGNOSIS — Z91040 Latex allergy status: Secondary | ICD-10-CM | POA: Insufficient documentation

## 2017-05-25 DIAGNOSIS — R519 Headache, unspecified: Secondary | ICD-10-CM

## 2017-05-25 DIAGNOSIS — Z87891 Personal history of nicotine dependence: Secondary | ICD-10-CM | POA: Insufficient documentation

## 2017-05-25 DIAGNOSIS — R51 Headache: Secondary | ICD-10-CM | POA: Insufficient documentation

## 2017-05-25 LAB — CBC WITH DIFFERENTIAL/PLATELET
Basophils Absolute: 0 10*3/uL (ref 0.0–0.1)
Basophils Relative: 0 %
EOS ABS: 0.3 10*3/uL (ref 0.0–0.7)
Eosinophils Relative: 2 %
HCT: 39 % (ref 36.0–46.0)
HEMOGLOBIN: 13.5 g/dL (ref 12.0–15.0)
Lymphocytes Relative: 32 %
Lymphs Abs: 4.1 10*3/uL — ABNORMAL HIGH (ref 0.7–4.0)
MCH: 31.7 pg (ref 26.0–34.0)
MCHC: 34.6 g/dL (ref 30.0–36.0)
MCV: 91.5 fL (ref 78.0–100.0)
MONO ABS: 0.7 10*3/uL (ref 0.1–1.0)
MONOS PCT: 6 %
NEUTROS PCT: 60 %
Neutro Abs: 7.7 10*3/uL (ref 1.7–7.7)
Platelets: 230 10*3/uL (ref 150–400)
RBC: 4.26 MIL/uL (ref 3.87–5.11)
RDW: 13.5 % (ref 11.5–15.5)
WBC: 12.8 10*3/uL — ABNORMAL HIGH (ref 4.0–10.5)

## 2017-05-25 LAB — COMPREHENSIVE METABOLIC PANEL
ALBUMIN: 4.1 g/dL (ref 3.5–5.0)
ALK PHOS: 55 U/L (ref 38–126)
ALT: 27 U/L (ref 14–54)
ANION GAP: 10 (ref 5–15)
AST: 20 U/L (ref 15–41)
BUN: 7 mg/dL (ref 6–20)
CALCIUM: 9.8 mg/dL (ref 8.9–10.3)
CO2: 24 mmol/L (ref 22–32)
Chloride: 108 mmol/L (ref 101–111)
Creatinine, Ser: 0.74 mg/dL (ref 0.44–1.00)
GFR calc non Af Amer: >60 mL/min (ref >60)
Glucose, Bld: 101 mg/dL — ABNORMAL HIGH (ref 65–99)
Potassium: 3.5 mmol/L (ref 3.5–5.1)
SODIUM: 142 mmol/L (ref 135–145)
Total Bilirubin: 0.5 mg/dL (ref 0.3–1.2)
Total Protein: 7.3 g/dL (ref 6.5–8.1)

## 2017-05-25 LAB — SEDIMENTATION RATE: Sed Rate: 12 mm/hr (ref 0–22)

## 2017-05-25 MED ORDER — SODIUM CHLORIDE 0.9 % IV BOLUS (SEPSIS)
1000.0000 mL | Freq: Once | INTRAVENOUS | Status: AC
  Administered 2017-05-25: 1000 mL via INTRAVENOUS

## 2017-05-25 MED ORDER — DIPHENHYDRAMINE HCL 50 MG/ML IJ SOLN
25.0000 mg | Freq: Once | INTRAMUSCULAR | Status: AC
  Administered 2017-05-25: 25 mg via INTRAVENOUS
  Filled 2017-05-25: qty 1

## 2017-05-25 MED ORDER — KETOROLAC TROMETHAMINE 30 MG/ML IJ SOLN
30.0000 mg | Freq: Once | INTRAMUSCULAR | Status: AC
  Administered 2017-05-25: 30 mg via INTRAVENOUS
  Filled 2017-05-25: qty 1

## 2017-05-25 MED ORDER — IBUPROFEN 800 MG PO TABS: 800.0000 mg | ORAL_TABLET | Freq: Three times a day (TID) | ORAL | 0 refills | Status: DC | PRN

## 2017-05-25 MED ORDER — PROCHLORPERAZINE EDISYLATE 5 MG/ML IJ SOLN
10.0000 mg | Freq: Once | INTRAMUSCULAR | Status: AC
  Administered 2017-05-25: 10 mg via INTRAVENOUS
  Filled 2017-05-25: qty 2

## 2017-05-25 MED ORDER — DEXAMETHASONE SODIUM PHOSPHATE 10 MG/ML IJ SOLN
10.0000 mg | Freq: Once | INTRAMUSCULAR | Status: AC
  Administered 2017-05-25: 10 mg via INTRAVENOUS
  Filled 2017-05-25: qty 1

## 2017-05-25 NOTE — ED Provider Notes (Signed)
[facility]-EMERGENCY DEPT Provider Note   CSN: [id number] Arrival date & time: 05/25/17  1918     History   Chief Complaint Chief Complaint  Patient presents with  . Headache    HPI Tracey Chen is a 31 y.o. female.   Headache   This is a new problem. The current episode started more than 1 week ago. The problem occurs constantly. The problem has not changed since onset.The headache is associated with nothing. The quality of the pain is described as dull. The patient is experiencing no pain. Pertinent negatives include no anorexia, no fever, no malaise/fatigue, no near-syncope, no nausea and no vomiting.    Past Medical History:  Diagnosis Date  . Anemia   . Anxiety    treated with lexapro at age 38  . Eczema   . Pregnancy induced hypertension 2012  . Ringworm     Patient Active Problem List   Diagnosis Date Noted  . History of cesarean delivery, currently pregnant 05/26/2012  . 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 Anemia 11/09/2010  . Pregnancy induced hypertension 11/09/2010  . ANXIETY 06/01/2008  . ECZEMA 06/01/2008    Past Surgical History:  Procedure Laterality Date  . CESAREAN SECTION    . CESAREAN SECTION  06/18/2012   Procedure: CESAREAN SECTION;  Surgeon: Freddrick March. Tenny Craw, MD;  Location: [facility];  Service: Obstetrics;  Laterality: N/A;  Repeat cesarean section of baby girl at 0256  APGAR 9/9                                                                      . WISDOM TOOTH EXTRACTION      OB History    Gravida Para Term Preterm AB Living   0 0 2   SAB TAB Ectopic Multiple Live Births   0 0 0 0 2       Home Medications    Prior to Admission medications   Medication Sig Start Date End Date Taking? Authorizing Provider  albuterol (PROVENTIL HFA;VENTOLIN HFA) 108 (90 Base) MCG/ACT inhaler Inhale 1-2 puffs into the lungs every 6 (six) hours as needed for wheezing or shortness of breath.  04/23/17  Yes Liberty Handy, PA-C  ibuprofen (ADVIL,MOTRIN) 200 MG tablet Take 800 mg by mouth every 6 (six) hours as needed for moderate pain.   Yes [provider]  levonorgestrel (MIRENA, 52 MG,) 20 MCG/24HR IUD 1 each by Intrauterine route once.   Yes [provider]  ibuprofen (ADVIL,MOTRIN) 800 MG tablet Take 1 tablet (800 mg total) by mouth every 8 (eight) hours as needed. 05/25/17   Elora Wolter, Barbara Cower, MD    Family History Family History  Problem Relation Age of Onset  . Thyroid disease Mother   . Mental illness Mother        PTSD from husband's death  . Asthma Neg Hx   . Cancer Neg Hx   . Heart disease Neg Hx   . Stroke Neg Hx     Social History Social History  Substance Use Topics  . Smoking status: Former Smoker    Packs/day: 0.50    Types: Cigarettes    Quit date: 11/03/2011  . Smokeless tobacco: Never Used  . Alcohol use No  Allergies   Latex   Review of Systems Review of Systems  Constitutional: Negative for fever and malaise/fatigue.  Cardiovascular: Negative for near-syncope.  Gastrointestinal: Negative for anorexia, nausea and vomiting.  Neurological: Positive for headaches.  All other systems reviewed and are negative.    Physical Exam Updated Vital Signs BP 113/74 (BP Location: Left Arm)   Pulse 68   Temp 98.3 F (36.8 C) (Oral)   Resp 14   SpO2 97%   Physical Exam  Constitutional: She is oriented to person, place, and time. She appears well-developed and well-nourished.  HENT:  Head: Normocephalic and atraumatic.  Eyes: Conjunctivae and EOM are normal.  Neck: Normal range of motion.  Cardiovascular: Normal rate and regular rhythm.   Pulmonary/Chest: Effort normal and breath sounds normal. No stridor. No respiratory distress.  Abdominal: Soft. Bowel sounds are normal. She exhibits no distension.  Musculoskeletal: Normal range of motion. She exhibits no edema or deformity.  Neurological: She is alert and oriented to  person, place, and time. No cranial nerve deficit. Coordination normal.  No altered mental status, able to give full seemingly accurate history.  Face is symmetric, EOM's intact, pupils equal and reactive, vision intact, tongue and uvula midline without deviation. Upper and Lower extremity motor 5/5, intact pain perception in distal extremities, 2+ reflexes in biceps, patella and achilles tendons. Able to perform finger to nose normal with both hands. Walks without assistance or evident ataxia.   Skin: Skin is warm and dry.  Nursing note and vitals reviewed.    ED Treatments / Results  Labs (all labs ordered are listed, but only abnormal results are displayed) Labs Reviewed  CBC WITH DIFFERENTIAL/PLATELET - Abnormal; Notable for the following:       Result Value   WBC 12.8 (*)    Lymphs Abs 4.1 (*)    All other components within normal limits  COMPREHENSIVE METABOLIC PANEL - Abnormal; Notable for the following:    Glucose, Bld 101 (*)    All other components within normal limits  SEDIMENTATION RATE  HCG, QUANTITATIVE, PREGNANCY    EKG  EKG Interpretation None       Radiology Ct Head Wo Contrast  Result Date: 05/25/2017 CLINICAL DATA:  Headache light sensitivity and nausea EXAM: CT HEAD WITHOUT CONTRAST CT MAXILLOFACIAL WITHOUT CONTRAST TECHNIQUE: Multidetector CT imaging of the head and maxillofacial structures were performed using the standard protocol without intravenous contrast. Multiplanar CT image reconstructions of the maxillofacial structures were also generated. COMPARISON:  None. FINDINGS: CT HEAD FINDINGS Brain: No evidence of acute infarction, hemorrhage, hydrocephalus, extra-axial collection or mass lesion/mass effect. Vascular: No hyperdense vessel or unexpected calcification. Skull: Normal. Negative for fracture or focal lesion. Other: None CT MAXILLOFACIAL FINDINGS Osseous: Bilateral mandibular heads are normally position. Mastoid air cells clear. No mandibular  fracture. Pterygoid plates, zygomatic arches and nasal bones are intact. Orbits: Negative. No traumatic or inflammatory finding. Sinuses: Moderate mucosal thickening within the left sphenoid sinus with mild thickening in the right sphenoid sinus. Minimal mucosal thickening within the ethmoid sinuses. No acute fluid levels. Midline nasal septum. Patent ostiomeatal orifices. Soft tissues: Negative. IMPRESSION: 1. No CT evidence for acute intracranial abnormality. 2. Paranasal sinus disease, mainly within the left sphenoid sinus. Electronically Signed   By: Jasmine Pang M.D.   On: 05/25/2017 21:47   Ct Maxillofacial Wo Contrast  Result Date: 05/25/2017 CLINICAL DATA:  Headache light sensitivity and nausea EXAM: CT HEAD WITHOUT CONTRAST CT MAXILLOFACIAL WITHOUT CONTRAST TECHNIQUE: Multidetector CT imaging of the  head and maxillofacial structures were performed using the standard protocol without intravenous contrast. Multiplanar CT image reconstructions of the maxillofacial structures were also generated. COMPARISON:  None. FINDINGS: CT HEAD FINDINGS Brain: No evidence of acute infarction, hemorrhage, hydrocephalus, extra-axial collection or mass lesion/mass effect. Vascular: No hyperdense vessel or unexpected calcification. Skull: Normal. Negative for fracture or focal lesion. Other: None CT MAXILLOFACIAL FINDINGS Osseous: Bilateral mandibular heads are normally position. Mastoid air cells clear. No mandibular fracture. Pterygoid plates, zygomatic arches and nasal bones are intact. Orbits: Negative. No traumatic or inflammatory finding. Sinuses: Moderate mucosal thickening within the left sphenoid sinus with mild thickening in the right sphenoid sinus. Minimal mucosal thickening within the ethmoid sinuses. No acute fluid levels. Midline nasal septum. Patent ostiomeatal orifices. Soft tissues: Negative. IMPRESSION: 1. No CT evidence for acute intracranial abnormality. 2. Paranasal sinus disease, mainly within the  left sphenoid sinus. Electronically Signed   By: Jasmine Pang M.D.   On: 05/25/2017 21:47    Procedures Procedures (including critical care time)  Medications Ordered in ED Medications  prochlorperazine (COMPAZINE) injection 10 mg (10 mg Intravenous Given 05/25/17 2139)  diphenhydrAMINE (BENADRYL) injection 25 mg (25 mg Intravenous Given 05/25/17 2138)  dexamethasone (DECADRON) injection 10 mg (10 mg Intravenous Given 05/25/17 2139)  sodium chloride 0.9 % bolus 1,000 mL (0 mLs Intravenous Stopped 05/25/17 2335)  ketorolac (TORADOL) 30 MG/ML injection 30 mg (30 mg Intravenous Given 05/25/17 2348)     Initial Impression / Assessment and Plan / ED Course  I have reviewed the triage vital signs and the nursing notes.  Pertinent labs & imaging results that were available during my care of the patient were reviewed by me and considered in my medical decision making (see chart for details).    The patient arrived with signs and symptoms consistent with a migraine headache. The patient has history of migraines. This feels like previous migraines.The patient has a reassuring neuro exam lessening chances of intracranial abnormalities. The patient was given decadron, Reglan, Benadryl with patient's pain significantly improved and is ready for discharge. The patient remained in no acute distress, hemodynamically stable with reassuring neuro exam. The patient was then discharged from the Emergency Department with no further acute issues. Recommend follow up with neurologist or PCP within the next week.   Final Clinical Impressions(s) / ED Diagnoses   Final diagnoses:  Nonintractable headache, unspecified chronicity pattern, unspecified headache type    New Prescriptions New Prescriptions   IBUPROFEN (ADVIL,MOTRIN) 800 MG TABLET    Take 1 tablet (800 mg total) by mouth every 8 (eight) hours as needed.     Janya Eveland, Barbara Cower, MD 05/26/17 0000

## 2017-05-25 NOTE — ED Notes (Signed)
Pt in CT.

## 2017-05-25 NOTE — ED Triage Notes (Signed)
Patient c/o headache x10 days. Reports light sensitivity, nausea, dizziness with movement and left eye pain. Reports dx of bronchitis approximately 2 months ago but states she still has a  productive cough.

## 2017-05-25 NOTE — ED Notes (Signed)
Patient reports waking up Saturday 05/19/17 with left sided puffy red eye.  Head to touch on the left forehead has been hot to touch

## 2017-09-07 ENCOUNTER — Emergency Department (HOSPITAL_COMMUNITY): Payer: Self-pay

## 2017-09-07 ENCOUNTER — Encounter (HOSPITAL_COMMUNITY): Payer: Self-pay | Admitting: Emergency Medicine

## 2017-09-07 ENCOUNTER — Emergency Department (HOSPITAL_COMMUNITY)
Admission: EM | Admit: 2017-09-07 | Discharge: 2017-09-07 | Disposition: A | Discharge status: Home / Self Care | Payer: Self-pay | Attending: Emergency Medicine | Admitting: Emergency Medicine

## 2017-09-07 DIAGNOSIS — R05 Cough: Secondary | ICD-10-CM

## 2017-09-07 DIAGNOSIS — R6889 Other general symptoms and signs: Secondary | ICD-10-CM

## 2017-09-07 DIAGNOSIS — R059 Cough, unspecified: Secondary | ICD-10-CM

## 2017-09-07 DIAGNOSIS — Z87891 Personal history of nicotine dependence: Secondary | ICD-10-CM | POA: Insufficient documentation

## 2017-09-07 DIAGNOSIS — J029 Acute pharyngitis, unspecified: Secondary | ICD-10-CM

## 2017-09-07 DIAGNOSIS — J111 Influenza due to unidentified influenza virus with other respiratory manifestations: Secondary | ICD-10-CM | POA: Insufficient documentation

## 2017-09-07 DIAGNOSIS — R062 Wheezing: Secondary | ICD-10-CM

## 2017-09-07 DIAGNOSIS — J40 Bronchitis, not specified as acute or chronic: Secondary | ICD-10-CM

## 2017-09-07 DIAGNOSIS — J069 Acute upper respiratory infection, unspecified: Secondary | ICD-10-CM

## 2017-09-07 LAB — RAPID STREP SCREEN (MED CTR MEBANE ONLY): Streptococcus, Group A Screen (Direct): NEGATIVE

## 2017-09-07 MED ORDER — IPRATROPIUM BROMIDE 0.02 % IN SOLN
0.5000 mg | Freq: Once | RESPIRATORY_TRACT | Status: AC
  Administered 2017-09-07: 0.5 mg via RESPIRATORY_TRACT
  Filled 2017-09-07: qty 2.5

## 2017-09-07 MED ORDER — ALBUTEROL SULFATE (2.5 MG/3ML) 0.083% IN NEBU
5.0000 mg | INHALATION_SOLUTION | Freq: Once | RESPIRATORY_TRACT | Status: AC
  Administered 2017-09-07: 5 mg via RESPIRATORY_TRACT
  Filled 2017-09-07: qty 6

## 2017-09-07 MED ORDER — PREDNISONE 20 MG PO TABS: ORAL_TABLET | ORAL | 0 refills | Status: DC

## 2017-09-07 MED ORDER — ALBUTEROL SULFATE HFA 108 (90 BASE) MCG/ACT IN AERS: 1.0000 | INHALATION_SPRAY | RESPIRATORY_TRACT | 0 refills | Status: DC | PRN

## 2017-09-07 MED ORDER — AZITHROMYCIN 500 MG PO TABS: 500.0000 mg | ORAL_TABLET | Freq: Every day | ORAL | 0 refills | Status: AC

## 2017-09-07 MED ORDER — ACETAMINOPHEN 325 MG PO TABS
650.0000 mg | ORAL_TABLET | Freq: Once | ORAL | Status: AC | PRN
  Administered 2017-09-07: 650 mg via ORAL
  Filled 2017-09-07: qty 2

## 2017-09-07 MED ORDER — PREDNISONE 20 MG PO TABS
60.0000 mg | ORAL_TABLET | Freq: Once | ORAL | Status: AC
  Administered 2017-09-07: 60 mg via ORAL
  Filled 2017-09-07: qty 3

## 2017-09-07 NOTE — ED Provider Notes (Signed)
[facility]-EMERGENCY DEPT Provider Note   CSN: [id number] Arrival date & time: 09/07/17  [id number]     History   Chief Complaint Chief Complaint  Patient presents with  . Fever  . Sore Throat  . Fatigue    HPI Tracey Chen is a 32 y.o. female with a PMHx of anemia, eczema, anxiety, and pregnancy induced HTN, who presents to the ED with complaints of URI symptoms x 5 days.  Symptoms include sore throat, cough with clear sputum production, lack of appetite, fatigue, fever with Tmax 101.4, body aches, chest tightness, and one episode of posttussive emesis yesterday.  She has been using her inhaler from a prior bronchitis diagnosis, DayQuil and NyQuil, and codeine cough syrup with no relief.  No known aggravating factors.  She denies history of asthma.  No known sick contacts although her child does go to school but he has not been sick.  She did not get a flu shot this year.  She quit smoking in September, during her last bout with bronchitis.  She denies any drooling, trismus, rhinorrhea, ear pain or drainage, chest pain, shortness of breath, wheezing, hemoptysis, abdominal pain, N/V/D/C, dysuria, hematuria, arthralgias, numbness, tingling, focal weakness, rashes, or any other complaints at this time.   The history is provided by the patient and medical records. No language interpreter was used.  Fever   Associated symptoms include vomiting (once posttussive yesterday), sore throat and cough. Pertinent negatives include no chest pain and no diarrhea.  Sore Throat  This is a new problem. The current episode started more than 2 days ago. The problem occurs constantly. The problem has not changed since onset.Pertinent negatives include no chest pain, no abdominal pain and no shortness of breath. The symptoms are aggravated by swallowing. Nothing relieves the symptoms. Treatments tried: inhaler, dayquil/nyquil, and codeine cough syrup. The treatment provided no relief.     Past Medical History:  Diagnosis Date  . Anemia   . Anxiety    treated with lexapro at age 32  . Eczema   . Pregnancy induced hypertension 2012  . Ringworm     Patient Active Problem List   Diagnosis Date Noted  . History of cesarean delivery, currently pregnant 05/26/2012  . 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 Anemia 11/09/2010  . Pregnancy induced hypertension 11/09/2010  . ANXIETY 06/01/2008  . ECZEMA 06/01/2008    Past Surgical History:  Procedure Laterality Date  . CESAREAN SECTION    . CESAREAN SECTION  06/18/2012   Procedure: CESAREAN SECTION;  Surgeon: Freddrick MarchKendra H. Tenny Crawoss, MD;  Location: [facility];  Service: Obstetrics;  Laterality: N/A;  Repeat cesarean section of baby girl at 0256  APGAR 9/9  . WISDOM TOOTH EXTRACTION      OB History    Gravida Para Term Preterm AB Living   2 2 2  0 0 2   SAB TAB Ectopic Multiple Live Births   0 0 0 0 2       Home Medications    Prior to Admission medications   Medication Sig Start Date End Date Taking? Authorizing Provider  albuterol (PROVENTIL HFA;VENTOLIN HFA) 108 (90 Base) MCG/ACT inhaler Inhale 1-2 puffs into the lungs every 6 (six) hours as needed for wheezing or shortness of breath. 04/23/17   Liberty HandyGibbons, Claudia J, PA-C  ibuprofen (ADVIL,MOTRIN) 200 MG tablet Take 800 mg by mouth every 6 (six) hours as needed for moderate pain.    [provider]  ibuprofen (ADVIL,MOTRIN) 800 MG tablet Take  1 tablet (800 mg total) by mouth every 8 (eight) hours as needed. 05/25/17   Mesner, Barbara Cower, MD  levonorgestrel (MIRENA, 52 MG,) 20 MCG/24HR IUD 1 each by Intrauterine route once.    [provider]    Family History Family History  Problem Relation Age of Onset  . Thyroid disease Mother   . Mental illness Mother        PTSD from husband's death  . Asthma Neg Hx   . Cancer Neg Hx   . Heart disease Neg Hx   . Stroke Neg Hx     Social History Social History   Tobacco Use  . Smoking status: Former Smoker     Packs/day: 0.50    Types: Cigarettes    Last attempt to quit: 11/03/2011    Years since quitting: 5.8  . Smokeless tobacco: Never Used  Substance Use Topics  . Alcohol use: No    Alcohol/week: 0.0 oz  . Drug use: No     Allergies   Latex   Review of Systems Review of Systems  Constitutional: Positive for appetite change, chills, fatigue and fever.  HENT: Positive for sore throat. Negative for drooling, ear discharge, ear pain, rhinorrhea and trouble swallowing.   Respiratory: Positive for cough and chest tightness. Negative for shortness of breath and wheezing.   Cardiovascular: Negative for chest pain.  Gastrointestinal: Positive for vomiting (once posttussive yesterday). Negative for abdominal pain, constipation, diarrhea and nausea.  Genitourinary: Negative for dysuria and hematuria.  Musculoskeletal: Positive for myalgias. Negative for arthralgias.  Skin: Negative for color change and rash.  Allergic/Immunologic: Negative for immunocompromised state.  Neurological: Negative for weakness and numbness.  Psychiatric/Behavioral: Negative for confusion.   All other systems reviewed and are negative for acute change except as noted in the HPI.    Physical Exam Updated Vital Signs BP (!) 138/122 (BP Location: Right Arm)   Pulse (!) 110   Temp 99 F (37.2 C) (Oral)   Resp 17   SpO2 98%   Physical Exam  Constitutional: She is oriented to person, place, and time. Vital signs are normal. She appears well-developed and well-nourished.  Non-toxic appearance. No distress.  Low grade temp 99 but she feels warm/possibly febrile, nontoxic, NAD, not septic appearing and not severely ill appearing  HENT:  Head: Normocephalic and atraumatic.  Right Ear: Hearing, external ear and ear canal normal. Tympanic membrane is not injected, not erythematous and not bulging. A middle ear effusion is present.  Left Ear: Hearing, external ear and ear canal normal. Tympanic membrane is not injected,  not erythematous and not bulging. A middle ear effusion is present.  Nose: Mucosal edema present.  Mouth/Throat: Uvula is midline and mucous membranes are normal. No trismus in the jaw. No uvula swelling. Posterior oropharyngeal edema and posterior oropharyngeal erythema present. No oropharyngeal exudate or tonsillar abscesses. Tonsils are 1+ on the right. Tonsils are 1+ on the left. No tonsillar exudate.  Ear with serous effusions bilaterally but no TM erythema or bulging, canals clear. Nose mildly congested. Oropharynx injected, without uvular swelling or deviation, no trismus or drooling, 1+ b/l tonsillar swelling and erythema, one small tonsillolith on L tonsil, but no exudates.  No PTA.   Eyes: Conjunctivae and EOM are normal. Right eye exhibits no discharge. Left eye exhibits no discharge.  Neck: Normal range of motion. Neck supple.  Cardiovascular: Regular rhythm, normal heart sounds and intact distal pulses. Tachycardia present. Exam reveals no gallop and no friction rub.  No  murmur heard. Slightly tachycardic  Pulmonary/Chest: Effort normal. No respiratory distress. She has no decreased breath sounds. She has wheezes. She has rhonchi. She has no rales.  Scattered rhonchi throughout with faint expiratory wheezing, no rhonchi, no hypoxia or increased WOB, speaking in full sentences, SpO2 98% on RA   Abdominal: Soft. Normal appearance and bowel sounds are normal. She exhibits no distension. There is no tenderness. There is no rigidity, no rebound, no guarding, no CVA tenderness, no tenderness at McBurney's point and negative Murphy's sign.  Musculoskeletal: Normal range of motion.  Neurological: She is alert and oriented to person, place, and time. She has normal strength. No sensory deficit.  Skin: Skin is warm, dry and intact. No rash noted.  Psychiatric: She has a normal mood and affect.  Nursing note and vitals reviewed.    ED Treatments / Results  Labs (all labs ordered are listed,  but only abnormal results are displayed) Labs Reviewed  RAPID STREP SCREEN (NOT AT [facility])  CULTURE, GROUP A STREP [facility])    EKG  EKG Interpretation None       Radiology Dg Chest 2 View  Result Date: 09/07/2017 CLINICAL DATA:  Chest pressure EXAM: CHEST  2 VIEW COMPARISON:  04/23/2017 FINDINGS: Normal heart size. Lungs clear. No pneumothorax. No pleural effusion. IMPRESSION: No active cardiopulmonary disease. Electronically Signed   By: Jolaine Click M.D.   On: 09/07/2017 11:35    Procedures Procedures (including critical care time)  Medications Ordered in ED Medications  acetaminophen (TYLENOL) tablet 650 mg (650 mg Oral Given 09/07/17 1125)  albuterol (PROVENTIL) (2.5 MG/3ML) 0.083% nebulizer solution 5 mg (5 mg Nebulization Given 09/07/17 1245)  ipratropium (ATROVENT) nebulizer solution 0.5 mg (0.5 mg Nebulization Given 09/07/17 1245)  predniSONE (DELTASONE) tablet 60 mg (60 mg Oral Given 09/07/17 1245)     Initial Impression / Assessment and Plan / ED Course  I have reviewed the triage vital signs and the nursing notes.  Pertinent labs & imaging results that were available during my care of the patient were reviewed by me and considered in my medical decision making (see chart for details).     32 y.o. female here with URI symptoms x5 days. On exam, low grade temp 99 with mild tachycardia, but does not appear septic or severely ill, throat injected with small tonsillolith on L tonsil, mild tonsillar swelling but no exudates, no PTA; ears with serous effusions bilaterally, nose congested, lungs with faint expiratory wheezing and scattered rhonchi throughout. RST neg and CXR neg. I suspect the flu, but at this point there would be nothing to do about it, so pt declined being tested. Will give duoneb and prednisone, then reassess shortly.   1:38 PM Pt feeling much better and lung sounds greatly improved after duoneb. Of note, recheck of temp shows 99.9 (after tylenol) so I suspect  she was febrile when she arrived; HR improving now. Will d/c home with short course of prednisone and inhaler. Overall symptoms possibly viral (?flu), however could still be a bacterial process; will give safety script for azithromycin, instructed to hold off taking it unless fevers persist for 2-3 days longer or her symptoms worsen, otherwise don't take it if she improves and her fevers resolve. Advised other OTC remedies for symptomatic relief, and f/up with [facility] in 5-7 days for recheck and to establish medical care. I explained the diagnosis and have given explicit precautions to return to the ER including for any other new or worsening symptoms. The patient understands  and accepts the medical plan as it's been dictated and I have answered their questions. Discharge instructions concerning home care and prescriptions have been given. The patient is STABLE and is discharged to home in good condition.    Final Clinical Impressions(s) / ED Diagnoses   Final diagnoses:  Upper respiratory tract infection, unspecified type  Pharyngitis, unspecified etiology  Flu-like symptoms  Cough  Wheezing  Bronchitis    ED Discharge Orders        Ordered    albuterol (PROVENTIL HFA;VENTOLIN HFA) 108 (90 Base) MCG/ACT inhaler  Every 4 hours PRN     09/07/17 1336    predniSONE (DELTASONE) 20 MG tablet     09/07/17 1336    azithromycin (ZITHROMAX) 500 MG tablet  Daily     09/07/17 [address] [place], [place] 09/07/17 1339    Lorre Nick, MD 09/08/17 [phone]

## 2017-09-07 NOTE — ED Triage Notes (Signed)
Patient reports she was recently diagnosed with bronchitis but has been having fevers since Sunday, cough, fatigue and not been able to work all week. C/o painful to swallow and sore throat. Reports one episode of emesis yesterday, but has not been eating much to throw up. Denies abdominal pain, just decreased appetite.

## 2017-09-07 NOTE — Discharge Instructions (Signed)
Continue to stay well-hydrated. Gargle warm salt water and spit it out. Use chloraseptic spray as needed for sore throat. Continue to alternate between Tylenol and Ibuprofen for pain or fever. Use Mucinex for cough suppression/expectoration of mucus. Use netipot and flonase to help with nasal congestion. May consider over-the-counter Benadryl or other antihistamine to decrease secretions and for help with your symptoms.  Use inhaler as directed, as needed for cough/chest congestion/wheezing/shortness of breath. Take prednisone as directed, starting tomorrow since you were given today's dose here in the ER. If your fever persists past 2-3 days from now or your symptoms are worsening, then start taking the antibiotic you were prescribed; however if you're improving, and your fevers resolve, then you DO NOT need to start the antibiotic. Follow up with the [facility] in 5-7 days for recheck of ongoing symptoms and to establish medical care. Return to emergency department for emergent changing or worsening of symptoms.

## 2017-09-07 NOTE — ED Notes (Signed)
Patient transported to X-ray 

## 2017-09-07 NOTE — ED Notes (Signed)
Bed: WTR9 Expected date:  Expected time:  Means of arrival:  Comments: 

## 2017-09-09 LAB — CULTURE, GROUP A STREP (THRC)

## 2018-04-30 ENCOUNTER — Inpatient Hospital Stay (HOSPITAL_COMMUNITY)
Admission: AD | Admit: 2018-04-30 | Discharge: 2018-04-30 | Disposition: A | Discharge status: Home / Self Care | Payer: Self-pay | Source: Ambulatory Visit | Attending: Obstetrics & Gynecology | Admitting: Obstetrics & Gynecology

## 2018-04-30 ENCOUNTER — Encounter (HOSPITAL_COMMUNITY): Payer: Self-pay | Admitting: *Deleted

## 2018-04-30 DIAGNOSIS — R102 Pelvic and perineal pain: Secondary | ICD-10-CM | POA: Insufficient documentation

## 2018-04-30 DIAGNOSIS — Z87891 Personal history of nicotine dependence: Secondary | ICD-10-CM | POA: Insufficient documentation

## 2018-04-30 DIAGNOSIS — Z975 Presence of (intrauterine) contraceptive device: Secondary | ICD-10-CM | POA: Insufficient documentation

## 2018-04-30 DIAGNOSIS — Z3200 Encounter for pregnancy test, result unknown: Secondary | ICD-10-CM

## 2018-04-30 DIAGNOSIS — Z79899 Other long term (current) drug therapy: Secondary | ICD-10-CM | POA: Insufficient documentation

## 2018-04-30 LAB — URINALYSIS, ROUTINE W REFLEX MICROSCOPIC
BILIRUBIN URINE: NEGATIVE
GLUCOSE, UA: NEGATIVE mg/dL
HGB URINE DIPSTICK: NEGATIVE
Ketones, ur: NEGATIVE mg/dL
Leukocytes, UA: NEGATIVE
Nitrite: NEGATIVE
PROTEIN: NEGATIVE mg/dL
Specific Gravity, Urine: 1.018 (ref 1.005–1.030)
pH: 7 (ref 5.0–8.0)

## 2018-04-30 LAB — CBC WITH DIFFERENTIAL/PLATELET
Basophils Absolute: 0 10*3/uL (ref 0.0–0.1)
Basophils Relative: 0 %
EOS PCT: 1 %
Eosinophils Absolute: 0.1 10*3/uL (ref 0.0–0.7)
HEMATOCRIT: 40.7 % (ref 36.0–46.0)
Hemoglobin: 13.7 g/dL (ref 12.0–15.0)
Lymphocytes Relative: 41 %
Lymphs Abs: 2.7 10*3/uL (ref 0.7–4.0)
MCH: 32.2 pg (ref 26.0–34.0)
MCHC: 33.7 g/dL (ref 30.0–36.0)
MCV: 95.5 fL (ref 78.0–100.0)
MONO ABS: 0.2 10*3/uL (ref 0.1–1.0)
MONOS PCT: 4 %
NEUTROS ABS: 3.6 10*3/uL (ref 1.7–7.7)
Neutrophils Relative %: 54 %
PLATELETS: 223 10*3/uL (ref 150–400)
RBC: 4.26 MIL/uL (ref 3.87–5.11)
RDW: 13.9 % (ref 11.5–15.5)
WBC: 6.6 10*3/uL (ref 4.0–10.5)

## 2018-04-30 LAB — HCG, QUANTITATIVE, PREGNANCY: hCG, Beta Chain, Quant, S: <1 m[IU]/mL (ref <5)

## 2018-04-30 LAB — POCT PREGNANCY, URINE: Preg Test, Ur: NEGATIVE

## 2018-04-30 NOTE — MAU Note (Signed)
Pt started having pelvic pressure yesterday at work, felt like couldn't sit all the way down.  Has IUD, was placed Dec 2013, hasn't been replaced.   Started having periods again in February, last month it was just spotting,  had faint positive HPT yesterday & today.

## 2018-04-30 NOTE — Discharge Instructions (Signed)
In late 2019, the [facility] will be moving to the [place] campus. At that time, the MAU (Maternity Admissions Unit), where you are being seen today, will no longer take care of non-pregnant patients. We strongly encourage you to find a doctor's office before that time, so that you can be seen with any GYN concerns, like vaginal discharge, urinary tract infection, etc.. in a timely manner. ° °In order to make an office visit more convenient, the [facility] will be offering evening hours with same-day appointments, walk-in appointments and scheduled appointments available during this time. ° °[facility] Hours: °Monday - 8am - 7:30 pm with walk-in between 4pm- 7:30 pm °Tuesday - 8 am - 5 pm (starting 11/20/17 we will be open late and accepting walk-ins from 4pm - 7:30pm) °Wednesday - 8 am - 5 pm (starting 02/20/18 we will be open late and accepting walk-ins from 4pm - 7:30pm) °Thursday 8 am - 5 pm (starting 05/23/18 we will be open late and accepting walk-ins from 4pm - 7:30pm) °Friday 8 am - 5 pm ° °For an appointment please call the [facility] at [phone] ° °For urgent needs, [facility] is also available for management of urgent GYN complaints such as vaginal discharge or urinary tract infections. ° ° °Primary care follow up  °[facility] (will see you even if you do not have sickle cell): [phone] °[facility]: [phone] °[facility]: [phone] °[facility] [phone] ° ° °

## 2018-04-30 NOTE — MAU Provider Note (Signed)
History     CSN: [id number]  Arrival date and time: 04/30/18 0941   First Provider Initiated Contact with Patient 04/30/18 1122      Chief Complaint  Patient presents with  . pelvic pressure  . possibly pregnant   Tracey Chen is a 32 y.o. [phone] who is here today with pelvic pressure and 2 faint + UPT. Pain was 7/10 yesterday, but none today.   Pelvic Pain  The patient's primary symptoms include pelvic pain. The patient's pertinent negatives include no vaginal discharge. This is a new problem. The current episode started yesterday. The problem occurs intermittently. The problem has been resolved. Pain severity now: 0/10 at this time. The problem affects both sides. She is not pregnant. Pertinent negatives include no chills, dysuria, fever, frequency, nausea or vomiting. Nothing aggravates the symptoms. She has tried nothing for the symptoms. She uses an IUD for contraception. Her menstrual history has been irregular (03/05/18 ).    OB History    Gravida  2   Para  2   Term  2   Preterm  0   AB  0   Living  2     SAB  0   TAB  0   Ectopic  0   Multiple  0   Live Births  2           Past Medical History:  Diagnosis Date  . Anemia   . Anxiety    treated with lexapro at age 77  . Eczema   . Pregnancy induced hypertension 2012  . Ringworm     Past Surgical History:  Procedure Laterality Date  . CESAREAN SECTION    . CESAREAN SECTION  06/18/2012   Procedure: CESAREAN SECTION;  Surgeon: Freddrick March. Tenny Craw, MD;  Location: [facility];  Service: Obstetrics;  Laterality: N/A;  Repeat cesarean section of baby girl at 0256  APGAR 9/9                                                                      . WISDOM TOOTH EXTRACTION      Family History  Problem Relation Age of Onset  . Thyroid disease Mother   . Mental illness Mother        PTSD from husband's death  . Asthma Neg Hx   . Cancer Neg  Hx   . Heart disease Neg Hx   . Stroke Neg Hx     Social History   Tobacco Use  . Smoking status: Former Smoker    Packs/day: 0.50    Types: Cigarettes    Last attempt to quit: 11/03/2011    Years since quitting: 6.4  . Smokeless tobacco: Never Used  Substance Use Topics  . Alcohol use: No  . Drug use: No    Allergies:  Allergies  Allergen Reactions  . Latex Hives and Rash    Medications Prior to Admission  Medication Sig Dispense Refill Last Dose  . albuterol (PROVENTIL HFA;VENTOLIN HFA) 108 (90 Base) MCG/ACT inhaler Inhale 1-2 puffs into the lungs every 6 (six) hours as needed for wheezing or shortness of breath. 1 Inhaler 0 09/07/2017 at Unknown time  . albuterol (PROVENTIL HFA;VENTOLIN HFA) 108 (90 Base) MCG/ACT inhaler Inhale 1-2  puffs into the lungs every 4 (four) hours as needed for wheezing or shortness of breath (or cough). 1 Inhaler 0   . Diphenhydramine-Acetaminophen (TYLENOL COLD RELIEF NIGHTTIME PO) Take 2 tablets by mouth every 6 (six) hours as needed (COUGH AND CONGESTION).   09/06/2017 at Unknown time  . ibuprofen (ADVIL,MOTRIN) 800 MG tablet Take 1 tablet (800 mg total) by mouth every 8 (eight) hours as needed. (Patient not taking: Reported on 09/07/2017) 30 tablet 0 Completed Course at Unknown time  . levonorgestrel (MIRENA, 52 MG,) 20 MCG/24HR IUD 1 each by Intrauterine route once.   08/20/2012  . predniSONE (DELTASONE) 20 MG tablet 3 tabs po daily x 3 days STARTING 09/08/17 9 tablet 0     Review of Systems  Constitutional: Negative for chills and fever.  Gastrointestinal: Negative for nausea and vomiting.  Genitourinary: Positive for pelvic pain. Negative for dysuria, frequency, vaginal bleeding and vaginal discharge.   Physical Exam   Blood pressure (!) 126/58, pulse 79, temperature 97.9 F (36.6 C), temperature source Oral, resp. rate 18, height 5\' 5"  (1.651 m), weight 78.5 kg, last menstrual period 03/05/2018, unknown if currently  breastfeeding.  Physical Exam  Nursing note and vitals reviewed. Constitutional: She is oriented to person, place, and time. She appears well-developed and well-nourished. No distress.  HENT:  Head: Normocephalic.  Cardiovascular: Normal rate.  Respiratory: Effort normal.  GI: Soft. There is no tenderness. There is no rebound.  Neurological: She is alert and oriented to person, place, and time.  Skin: Skin is warm and dry.  Psychiatric: She has a normal mood and affect.   Results for orders placed or performed during the hospital encounter of 04/30/18 (from the past 24 hour(s))  Urinalysis, Routine w reflex microscopic     Status: Abnormal   Collection Time: 04/30/18 10:14 AM  Result Value Ref Range   Color, Urine YELLOW YELLOW   APPearance HAZY (A) CLEAR   Specific Gravity, Urine 1.018 1.005 - 1.030   pH 7.0 5.0 - 8.0   Glucose, UA NEGATIVE NEGATIVE mg/dL   Hgb urine dipstick NEGATIVE NEGATIVE   Bilirubin Urine NEGATIVE NEGATIVE   Ketones, ur NEGATIVE NEGATIVE mg/dL   Protein, ur NEGATIVE NEGATIVE mg/dL   Nitrite NEGATIVE NEGATIVE   Leukocytes, UA NEGATIVE NEGATIVE  Pregnancy, urine POC     Status: None   Collection Time: 04/30/18 10:16 AM  Result Value Ref Range   Preg Test, Ur NEGATIVE NEGATIVE  CBC with Differential/Platelet     Status: None   Collection Time: 04/30/18 10:36 AM  Result Value Ref Range   WBC 6.6 4.0 - 10.5 K/uL   RBC 4.26 3.87 - 5.11 MIL/uL   Hemoglobin 13.7 12.0 - 15.0 g/dL   HCT 16.1 09.6 - 04.5 %   MCV 95.5 78.0 - 100.0 fL   MCH 32.2 26.0 - 34.0 pg   MCHC 33.7 30.0 - 36.0 g/dL   RDW 40.9 81.1 - 91.4 %   Platelets 223 150 - 400 K/uL   Neutrophils Relative % 54 %   Neutro Abs 3.6 1.7 - 7.7 K/uL   Lymphocytes Relative 41 %   Lymphs Abs 2.7 0.7 - 4.0 K/uL   Monocytes Relative 4 %   Monocytes Absolute 0.2 0.1 - 1.0 K/uL   Eosinophils Relative 1 %   Eosinophils Absolute 0.1 0.0 - 0.7 K/uL   Basophils Relative 0 %   Basophils Absolute 0.0 0.0 -  0.1 K/uL  hCG, quantitative, pregnancy     Status:  None   Collection Time: 04/30/18 10:36 AM  Result Value Ref Range   hCG, Beta Chain, Quant, S <1 <5 mIU/mL   MAU Course  Procedures  MDM Offered GC/CT, and wet prep. Patient declines at this time.   Assessment and Plan   1. Possible pregnancy   2. IUD (intrauterine device) in place   3. Pelvic pain    DC home Comfort measures reviewed  RX: none  Return to MAU as needed   Follow-up Information    Department, [facility] Follow up.   Contact information: [address] [place] [address] [phone]            Thressa Sheller 04/30/2018, 11:25 AM

## 2018-10-07 IMAGING — CR DG CHEST 2V
2 series · 2 of 2 positions shown · non-contrast
Comparison: None.

CLINICAL DATA: Cough

EXAM:
CHEST  2 VIEW

[w chest pa]
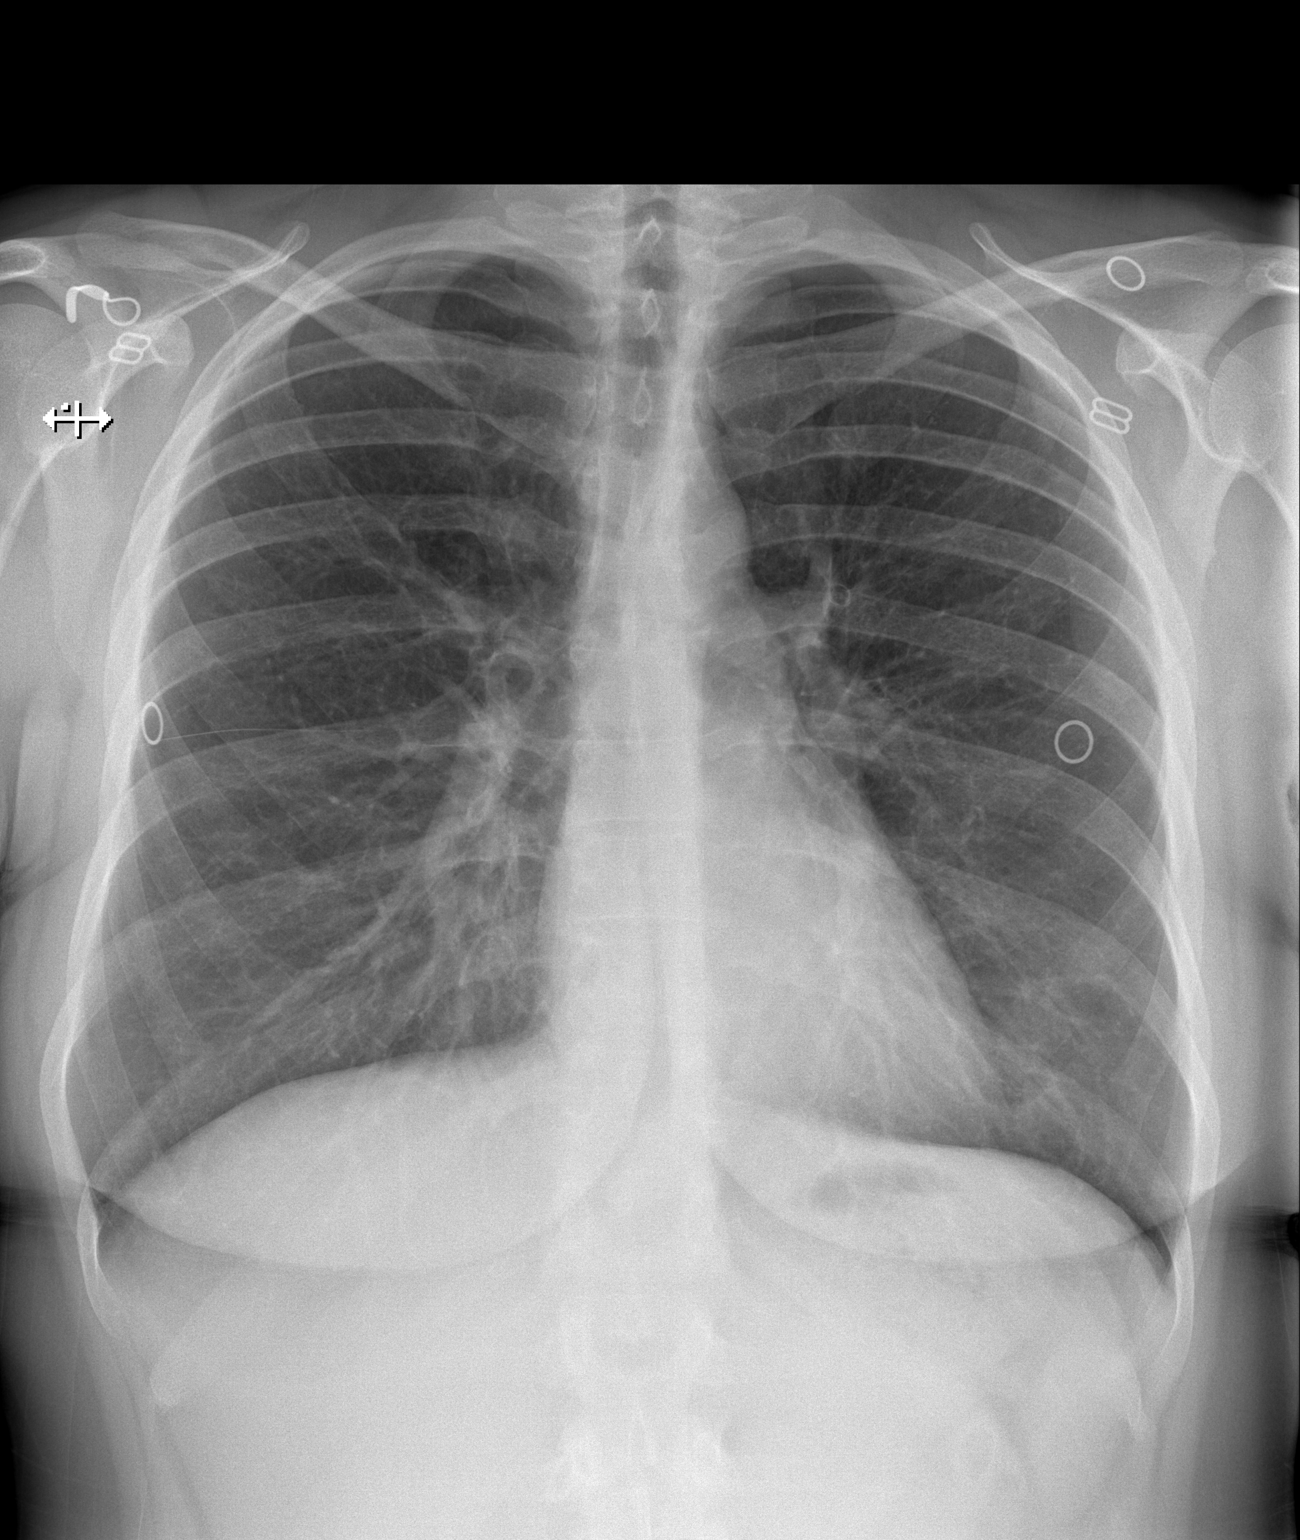

[w chest lat]
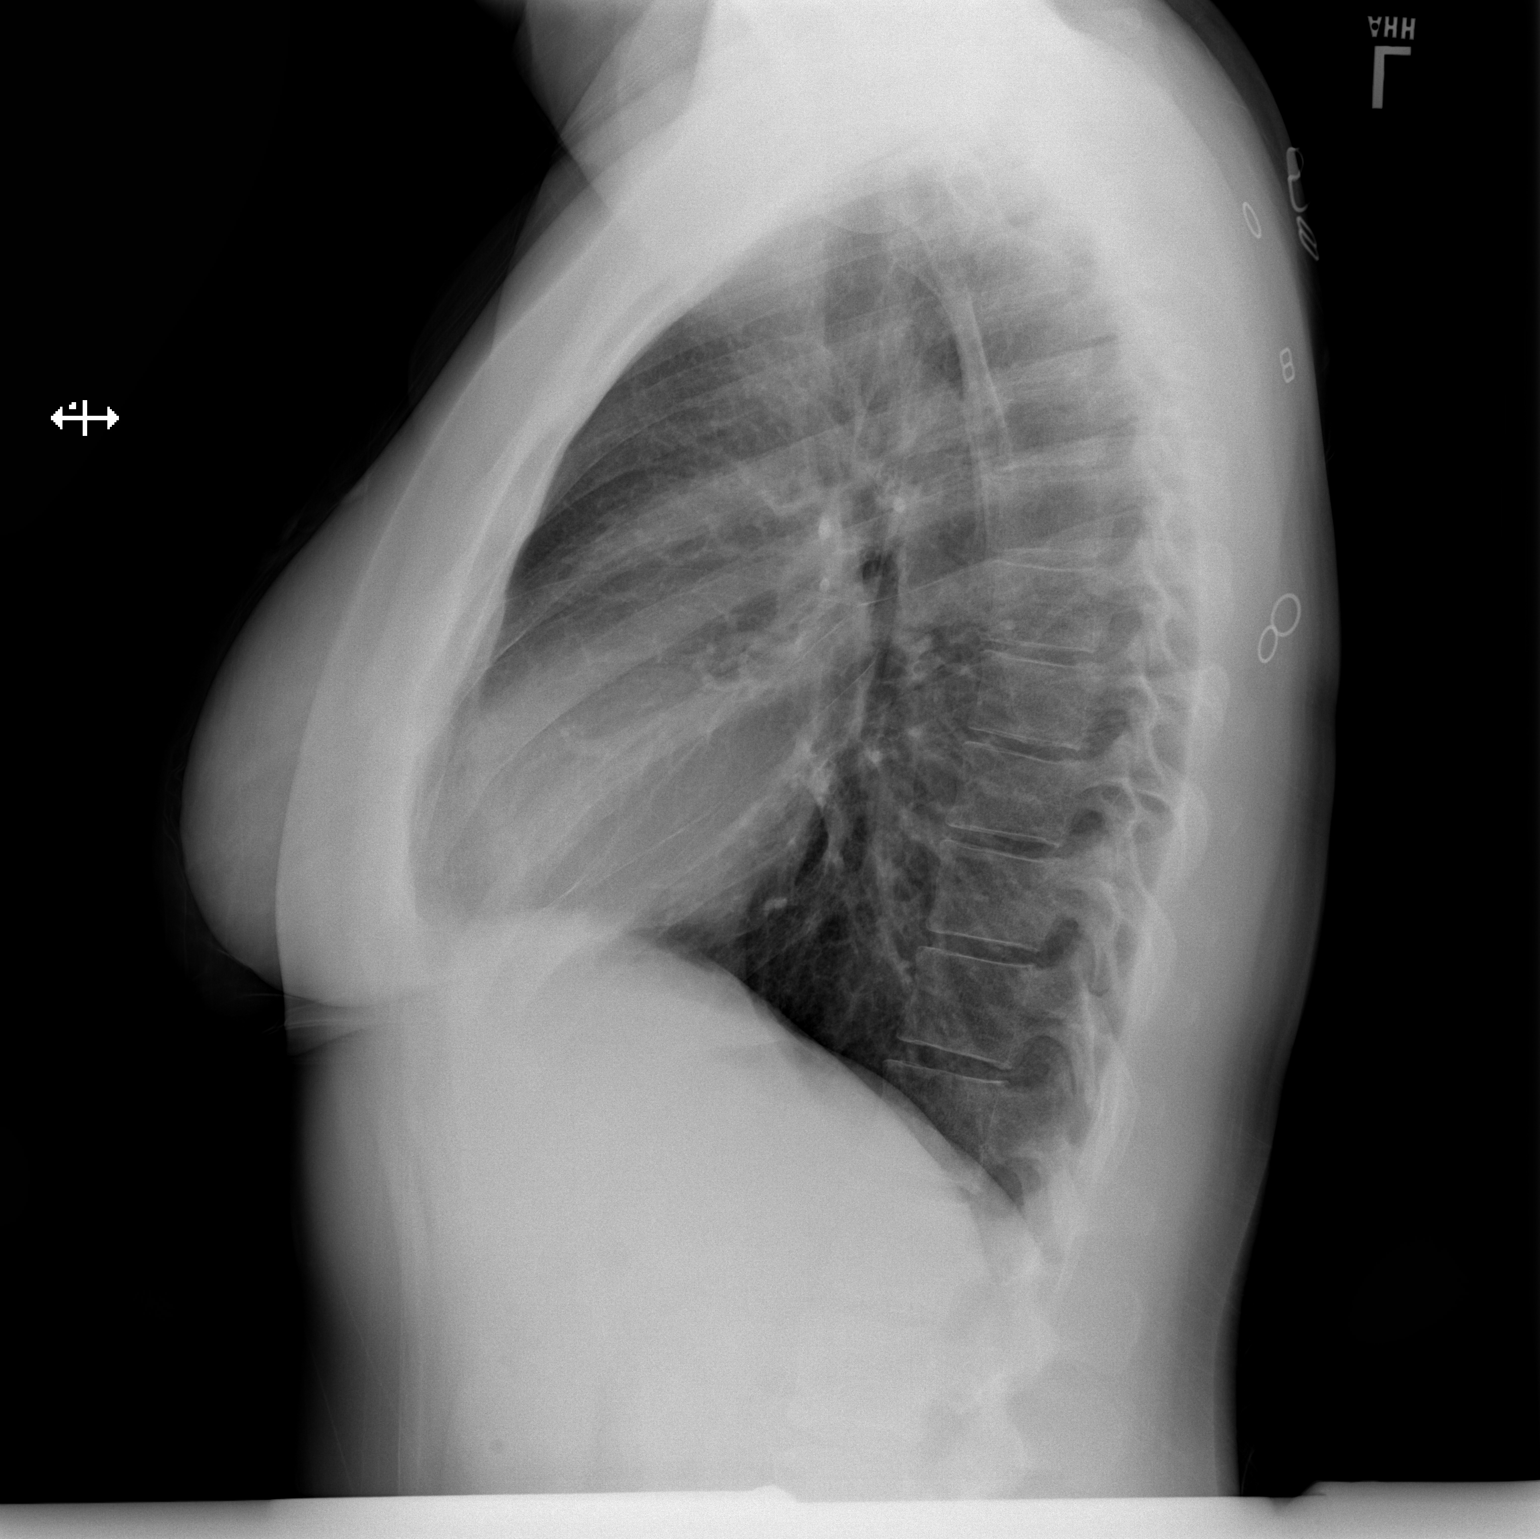

[2 of 2 positions shown; findings below may reference images not displayed]

FINDINGS: Normal heart size. Normal mediastinal contour. No pneumothorax. No
pleural effusion. Lungs appear clear, with no acute consolidative
airspace disease and no pulmonary edema.
IMPRESSION: No active cardiopulmonary disease.

## 2022-01-23 NOTE — Progress Notes (Signed)
HPI: Tracey Chen is a 36 y.o. female, who is here today to establish care.  Former PCP: Dr Doreene Nest Last preventive routine visit: Over a year ago. She sees gyn regularly.  Chronic medical problems: Anxiety, eczema, and pregnancy induced HTN among some.  Anxiety since high school. Anxiety caused nausea and vomiting if she eats a "full meal", so she eats small portions at the time.  No hx of depression. Going through separation from husband, her oldest child recently hospitalized for suicidal ideation; so she has been under a lot of stress. Sleeps about 4-6 hours. She tried Lexapro and Celexa in the past, did not help. No known FHx of bipolar disorder, her mother was adopted and she has no siblings.     01/24/2022   10:01 AM  Depression screen PHQ 2/9  Decreased Interest 1  Down, Depressed, Hopeless 1  PHQ - 2 Score 2  Altered sleeping 2  Tired, decreased energy 2  Change in appetite 3  Feeling bad or failure about yourself  3  Trouble concentrating 3  Moving slowly or fidgety/restless 3  Suicidal thoughts 0  PHQ-9 Score 18  Difficult doing work/chores Very difficult   Left ear fullness sensation for the past 1-2 weeks, gradually improving. URI 2-3 weeks ago, most symptoms have resolved. Some nasal congestion and post nasal drainage. She has not tried OTC medications.  Review of Systems  Constitutional:  Positive for fatigue. Negative for activity change, appetite change and fever.  HENT:  Negative for mouth sores, nosebleeds, sinus pressure and sore throat.   Eyes:  Negative for redness and visual disturbance.  Respiratory:  Negative for cough, shortness of breath and wheezing.   Cardiovascular:  Negative for chest pain, palpitations and leg swelling.  Gastrointestinal:  Negative for abdominal pain.       Negative for changes in bowel habits.  Genitourinary:  Negative for difficulty urinating.  Allergic/Immunologic: Positive for environmental allergies.   Neurological:  Negative for syncope, weakness and headaches.  Psychiatric/Behavioral:  Negative for confusion and hallucinations. The patient is nervous/anxious.   Rest see pertinent positives and negatives per HPI.  Current Outpatient Medications on File Prior to Visit  Medication Sig Dispense Refill   levonorgestrel (MIRENA) 20 MCG/24HR IUD 1 each by Intrauterine route once.     No current facility-administered medications on file prior to visit.   Past Medical History:  Diagnosis Date   Anemia    Anxiety    treated with lexapro at age 31   Eczema    Frequent headaches    Migraine    Pregnancy induced hypertension 08/21/2010   Ringworm    Allergies  Allergen Reactions   Latex Hives and Rash   Family History  Problem Relation Age of Onset   Hypertension Mother    Hyperlipidemia Mother    Drug abuse Mother    Depression Mother    COPD Mother    Asthma Mother    Arthritis Mother    Thyroid disease Mother    Mental illness Mother        PTSD from husband's death   Alcohol abuse Father    Heart attack Father    Cancer Neg Hx    Heart disease Neg Hx    Stroke Neg Hx     Social History   Socioeconomic History   Marital status: Married    Spouse name: Not on file   Number of children: 2   Years of education: Not on file  Highest education level: Not on file  Occupational History   Not on file  Tobacco Use   Smoking status: Former    Packs/day: 0.50    Types: Cigarettes    Quit date: 11/03/2011    Years since quitting: 10.2   Smokeless tobacco: Never  Vaping Use   Vaping Use: Former  Substance and Sexual Activity   Alcohol use: No   Drug use: No   Sexual activity: Yes    Partners: Male    Birth control/protection: None  Other Topics Concern   Not on file  Social History Narrative   Lives with son Tracey Chen (2012), fiance Tracey Chen, and fiancee's parents Tracey Chen and Tracey Chen.  High schoool education, never graduated.  2 black labs as pets.      Social Determinants of Health   Financial Resource Strain: Not on file  Food Insecurity: Not on file  Transportation Needs: Not on file  Physical Activity: Not on file  Stress: Not on file  Social Connections: Not on file   Vitals:   01/24/22 0950  BP: 120/70  Pulse: 100  Resp: 16  SpO2: 99%   Body mass index is 24.5 kg/m.  Physical Exam Vitals and nursing note reviewed.  Constitutional:      General: She is not in acute distress.    Appearance: She is well-developed.  HENT:     Head: Normocephalic and atraumatic.     Right Ear: Tympanic membrane, ear canal and external ear normal.     Left Ear: External ear normal. A middle ear effusion is present. Tympanic membrane is not erythematous.     Mouth/Throat:     Mouth: Mucous membranes are moist.     Pharynx: Oropharynx is clear.  Eyes:     Conjunctiva/sclera: Conjunctivae normal.  Cardiovascular:     Rate and Rhythm: Normal rate and regular rhythm.     Pulses:          Posterior tibial pulses are 2+ on the right side and 2+ on the left side.     Heart sounds: No murmur heard. Pulmonary:     Effort: Pulmonary effort is normal. No respiratory distress.     Breath sounds: Normal breath sounds.  Abdominal:     Palpations: Abdomen is soft. There is no hepatomegaly or mass.     Tenderness: There is no abdominal tenderness.  Lymphadenopathy:     Cervical: No cervical adenopathy.  Skin:    General: Skin is warm.     Findings: No erythema or rash.  Neurological:     General: No focal deficit present.     Mental Status: She is alert and oriented to person, place, and time.     Gait: Gait normal.  Psychiatric:        Mood and Affect: Mood is anxious.     Comments: Well groomed, good eye contact.   ASSESSMENT AND PLAN:  [name] was seen today for establish care.  Diagnoses and all orders for this visit:  Dysfunction of left eustachian tube We discussed Dx and treatment options. Symptoms are improving. Auto  inflation maneuvers,OTC decongestants, and chewing gum may help. Monitor for new symptoms. If not resolved in a few weeks or gets worse, we may need ENT evaluation.  GAD (generalized anxiety disorder) She has tried SSRI x 2, so recommend a SNRI, we discussed some side effects. Effexor XR 37.5 mg daily started today. CBT also recommended. Instructed about warning signs.  -  venlafaxine XR (EFFEXOR XR) 37.5 MG 24 hr capsule; Take 1 capsule (37.5 mg total) by mouth daily with breakfast.  Adjustment disorder with mixed anxiety and depressed mood Effexor 37.5 mg daily. Instructed about warning signs. She is planning on starting CBT with her son and family, pending appt. F/U in 6 weeks,before if needed.  -     venlafaxine XR (EFFEXOR XR) 37.5 MG 24 hr capsule; Take 1 capsule (37.5 mg total) by mouth daily with breakfast.  Return in about 6 weeks (around 03/07/2022).  Freyja Govea G. [place], MD  [facility]. [place] office.

## 2022-01-24 ENCOUNTER — Ambulatory Visit (INDEPENDENT_AMBULATORY_CARE_PROVIDER_SITE_OTHER): Payer: Self-pay | Admitting: Family Medicine

## 2022-01-24 ENCOUNTER — Encounter: Payer: Self-pay | Admitting: Family Medicine

## 2022-01-24 VITALS — BP 120/70 | HR 100 | Resp 16 | Ht 65.0 in | Wt 147.2 lb

## 2022-01-24 DIAGNOSIS — H6982 Other specified disorders of Eustachian tube, left ear: Secondary | ICD-10-CM

## 2022-01-24 DIAGNOSIS — F411 Generalized anxiety disorder: Secondary | ICD-10-CM

## 2022-01-24 DIAGNOSIS — F4323 Adjustment disorder with mixed anxiety and depressed mood: Secondary | ICD-10-CM

## 2022-01-24 MED ORDER — VENLAFAXINE HCL ER 37.5 MG PO CP24: 37.5000 mg | ORAL_CAPSULE | Freq: Every day | ORAL | 1 refills | Status: AC

## 2022-01-24 NOTE — Patient Instructions (Addendum)
A few things to remember from today's visit:  GAD (generalized anxiety disorder) - Plan: venlafaxine XR (EFFEXOR XR) 37.5 MG 24 hr capsule  Adjustment disorder with mixed anxiety and depressed mood - Plan: venlafaxine XR (EFFEXOR XR) 37.5 MG 24 hr capsule  Dysfunction of left eustachian tube  If you need refills please call your pharmacy. Do not use My Chart to request refills or for acute issues that need immediate attention.   Today we started Effexor, this type of medications can increase suicidal risk. This is more prevalent among children,adolecents, and young adults with major depression or other psychiatric disorders. It can also make depression worse. Most common side effects are gastrointestinal, self limited after a few weeks: diarrhea, nausea, constipation  Or diarrhea among some.  In general it is well tolerated. We will follow closely.  Please be sure medication list is accurate. If a new problem present, please set up appointment sooner than planned today.   Eustachian Tube Dysfunction  Eustachian tube dysfunction refers to a condition in which a blockage develops in the narrow passage that connects the middle ear to the back of the nose (eustachian tube). The eustachian tube regulates air pressure in the middle ear by letting air move between the ear and nose. It also helps to drain fluid from the middle ear space. Eustachian tube dysfunction can affect one or both ears. When the eustachian tube does not function properly, air pressure, fluid, or both can build up in the middle ear. What are the causes? This condition occurs when the eustachian tube becomes blocked or cannot open normally. Common causes of this condition include: Ear infections. Colds and other infections that affect the nose, mouth, and throat (upper respiratory tract). Allergies. Irritation from cigarette smoke. Irritation from stomach acid coming up into the esophagus (gastroesophageal reflux). The  esophagus is the part of the body that moves food from the mouth to the stomach. Sudden changes in air pressure, such as from descending in an airplane or scuba diving. Abnormal growths in the nose or throat, such as: Growths that line the nose (nasal polyps). Abnormal growth of cells (tumors). Enlarged tissue at the back of the throat (adenoids). What increases the risk? You are more likely to develop this condition if: You smoke. You are overweight. You are a child who has: Certain birth defects of the mouth, such as cleft palate. Large tonsils or adenoids. What are the signs or symptoms? Common symptoms of this condition include: A feeling of fullness in the ear. Ear pain. Clicking or popping noises in the ear. Ringing in the ear (tinnitus). Hearing loss. Loss of balance. Dizziness. Symptoms may get worse when the air pressure around you changes, such as when you travel to an area of high elevation, fly on an airplane, or go scuba diving. How is this diagnosed? This condition may be diagnosed based on: Your symptoms. A physical exam of your ears, nose, and throat. Tests, such as those that measure: The movement of your eardrum. Your hearing (audiometry). How is this treated? Treatment depends on the cause and severity of your condition. In mild cases, you may relieve your symptoms by moving air into your ears. This is called "popping the ears." In more severe cases, or if you have symptoms of fluid in your ears, treatment may include: Medicines to relieve congestion (decongestants). Medicines that treat allergies (antihistamines). Nasal sprays or ear drops that contain medicines that reduce swelling (steroids). A procedure to drain the fluid in your eardrum.  In this procedure, a small tube may be placed in the eardrum to: Drain the fluid. Restore the air in the middle ear space. A procedure to insert a balloon device through the nose to inflate the opening of the eustachian  tube (balloon dilation). Follow these instructions at home: Lifestyle Do not do any of the following until your health care provider approves: Travel to high altitudes. Fly in airplanes. Work in a Estate agent or room. Scuba dive. Do not use any products that contain nicotine or tobacco. These products include cigarettes, chewing tobacco, and vaping devices, such as e-cigarettes. If you need help quitting, ask your health care provider. Keep your ears dry. Wear fitted earplugs during showering and bathing. Dry your ears completely after. General instructions Take over-the-counter and prescription medicines only as told by your health care provider. Use techniques to help pop your ears as recommended by your health care provider. These may include: Chewing gum. Yawning. Frequent, forceful swallowing. Closing your mouth, holding your nose closed, and gently blowing as if you are trying to blow air out of your nose. Keep all follow-up visits. This is important. Contact a health care provider if: Your symptoms do not go away after treatment. Your symptoms come back after treatment. You are unable to pop your ears. You have: A fever. Pain in your ear. Pain in your head or neck. Fluid draining from your ear. Your hearing suddenly changes. You become very dizzy. You lose your balance. Get help right away if: You have a sudden, severe increase in any of your symptoms. Summary Eustachian tube dysfunction refers to a condition in which a blockage develops in the eustachian tube. It can be caused by ear infections, allergies, inhaled irritants, or abnormal growths in the nose or throat. Symptoms may include ear pain or fullness, hearing loss, or ringing in the ears. Mild cases are treated with techniques to unblock the ears, such as yawning or chewing gum. More severe cases are treated with medicines or procedures. This information is not intended to replace advice given to you by  your health care provider. Make sure you discuss any questions you have with your health care provider. Document Revised: 10/18/2020 Document Reviewed: 10/18/2020 Elsevier Patient Education  2023 [employer].

## 2022-03-14 NOTE — Progress Notes (Deleted)
    HPI:  Tracey Chen is a 36 y.o. female, who is here today to follow on recent visit.  Review of Systems Rest see pertinent positives and negatives per HPI.  Current Outpatient Medications on File Prior to Visit  Medication Sig Dispense Refill   levonorgestrel (MIRENA) 20 MCG/24HR IUD 1 each by Intrauterine route once.     venlafaxine XR (EFFEXOR XR) 37.5 MG 24 hr capsule Take 1 capsule (37.5 mg total) by mouth daily with breakfast. 30 capsule 1   No current facility-administered medications on file prior to visit.    Past Medical History:  Diagnosis Date   Anemia    Anxiety    treated with lexapro at age 40   Eczema    Frequent headaches    Migraine    Pregnancy induced hypertension 08/21/2010   Ringworm    Allergies  Allergen Reactions   Latex Hives and Rash    Social History   Socioeconomic History   Marital status: Married    Spouse name: Not on file   Number of children: 2   Years of education: Not on file   Highest education level: Not on file  Occupational History   Not on file  Tobacco Use   Smoking status: Former    Packs/day: 0.50    Types: Cigarettes    Quit date: 11/03/2011    Years since quitting: 10.3   Smokeless tobacco: Never  Vaping Use   Vaping Use: Former  Substance and Sexual Activity   Alcohol use: No   Drug use: No   Sexual activity: Yes    Partners: Male    Birth control/protection: None  Other Topics Concern   Not on file  Social History Narrative   Lives with son [name] (2012), fiance Davanee Klinkner, and fiancee's parents [name] and Kaelynn Igo.  High schoool education, never graduated.  2 black labs as pets.     Social Determinants of Health   Financial Resource Strain: Not on file  Food Insecurity: Not on file  Transportation Needs: Not on file  Physical Activity: Not on file  Stress: Not on file  Social Connections: Not on file    There were no vitals filed for this visit. There is no height or weight on file  to calculate BMI.  Physical Exam  ASSESSMENT AND PLAN:   There are no diagnoses linked to this encounter.  No orders of the defined types were placed in this encounter.   No problem-specific Assessment & Plan notes found for this encounter.   No follow-ups on file.   Betty G. [place], MD  [facility]. [place] office.

## 2022-03-15 ENCOUNTER — Ambulatory Visit: Payer: Self-pay | Admitting: Family Medicine
